# Patient Record
Sex: Female | Born: 2000 | Race: Black or African American | Hispanic: No | Marital: Married
Health system: Midwestern US, Community
[De-identification: ages and names within clinical notes are randomized; demographics above are authoritative.]

## PROBLEM LIST (undated history)

## (undated) DIAGNOSIS — O139 Gestational [pregnancy-induced] hypertension without significant proteinuria, unspecified trimester: Secondary | ICD-10-CM

## (undated) HISTORY — DX: Gestational (pregnancy-induced) hypertension without significant proteinuria, unspecified trimester: O13.9

## (undated) HISTORY — PX: WISDOM TOOTH EXTRACTION: SHX21

---
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
## 2018-10-02 ENCOUNTER — Telehealth: Payer: Self-pay | Admitting: Family Medicine

## 2018-10-02 NOTE — Telephone Encounter (Signed)
Attempted to call patient about her appointment on 8/19 @ 10:20. The home number was the patient's mother's number and mother gave me patient's number ( cell number). Cell number was called and there was no answer, voicemail left for patient to give the office a call back about her appointment.

## 2018-10-03 ENCOUNTER — Ambulatory Visit: Payer: Medicaid Other

## 2018-11-14 DIAGNOSIS — O26891 Other specified pregnancy related conditions, first trimester: Secondary | ICD-10-CM

## 2018-11-14 NOTE — ED Notes (Signed)
Patient complains of having a headache that started 3 days ago- patient also states that she has abdominal pain. Lower back pain . Leg pain- patient denies painful urination- states that she a a white discharge that PCP states is normal.

## 2018-11-14 NOTE — ED Triage Notes (Signed)
Patient complains of having a headache that started 3 days ago- patient also states that she has abdominal pain. Lower back pain . Leg pain- patient denies painful urination- states that she a a white discharge that PCP states is normal.

## 2018-11-15 ENCOUNTER — Inpatient Hospital Stay
Admit: 2018-11-15 | Discharge: 2018-11-15 | Disposition: A | Discharge status: Home / Self Care | Attending: Emergency Medicine

## 2018-11-15 LAB — CBC WITH AUTO DIFFERENTIAL
Basophils %: 0 % (ref 0–2)
Basophils Absolute: 0 10*3/uL (ref 0.0–0.1)
Eosinophils %: 1 % (ref 0–5)
Eosinophils Absolute: 0.1 10*3/uL (ref 0.0–0.4)
Hematocrit: 31.6 % — ABNORMAL LOW (ref 35.0–45.0)
Hemoglobin: 11.1 g/dL — ABNORMAL LOW (ref 12.0–16.0)
Lymphocytes %: 11 % — ABNORMAL LOW (ref 21–52)
Lymphocytes Absolute: 1 10*3/uL (ref 0.9–3.6)
MCH: 28.5 PG (ref 24.0–34.0)
MCHC: 35.1 g/dL (ref 31.0–37.0)
MCV: 81 FL (ref 74.0–97.0)
MPV: 8.6 FL — ABNORMAL LOW (ref 9.2–11.8)
Monocytes %: 7 % (ref 3–10)
Monocytes Absolute: 0.6 10*3/uL (ref 0.05–1.2)
Neutrophils %: 81 % — ABNORMAL HIGH (ref 40–73)
Neutrophils Absolute: 7.6 10*3/uL (ref 1.8–8.0)
Platelets: 355 10*3/uL (ref 135–420)
RBC: 3.9 M/uL — ABNORMAL LOW (ref 4.20–5.30)
RDW: 13.7 % (ref 11.6–14.5)
WBC: 9.2 10*3/uL (ref 4.6–13.2)

## 2018-11-15 LAB — COMPREHENSIVE METABOLIC PANEL
ALT: 20 U/L (ref 13–56)
AST: 14 U/L (ref 10–38)
Albumin/Globulin Ratio: 0.8 (ref 0.8–1.7)
Albumin: 3.1 g/dL — ABNORMAL LOW (ref 3.4–5.0)
Alkaline Phosphatase: 50 U/L (ref 45–117)
Anion Gap: 3 mmol/L (ref 3.0–18)
BUN: 10 MG/DL (ref 7.0–18)
Bun/Cre Ratio: 15 (ref 12–20)
CO2: 25 mmol/L (ref 21–32)
Calcium: 8.7 MG/DL (ref 8.5–10.1)
Chloride: 107 mmol/L (ref 100–111)
Creatinine: 0.65 MG/DL (ref 0.6–1.3)
EGFR IF NonAfrican American: >60 mL/min/{1.73_m2} (ref >60)
GFR African American: >60 mL/min/{1.73_m2} (ref >60)
Globulin: 4 g/dL (ref 2.0–4.0)
Glucose: 84 mg/dL (ref 74–99)
Potassium: 4 mmol/L (ref 3.5–5.5)
Sodium: 135 mmol/L — ABNORMAL LOW (ref 136–145)
Total Bilirubin: 0.3 MG/DL (ref 0.2–1.0)
Total Protein: 7.1 g/dL (ref 6.4–8.2)

## 2018-11-15 LAB — URINALYSIS W/ RFLX MICROSCOPIC
Bilirubin, Urine: NEGATIVE
Bilirubin: NEGATIVE
Blood, Urine: NEGATIVE
Blood: NEGATIVE
Glucose, Ur: NEGATIVE mg/dL
Glucose: NEGATIVE mg/dL
Ketone: NEGATIVE mg/dL
Ketones, Urine: NEGATIVE mg/dL
Leukocyte Esterase, Urine: NEGATIVE
Leukocyte Esterase: NEGATIVE
Nitrite, Urine: POSITIVE — AB
Nitrites: POSITIVE — AB
Protein, UA: NEGATIVE mg/dL
Protein: NEGATIVE mg/dL
Specific Gravity, UA: 1.016 (ref 1.005–1.030)
Specific gravity: 1.016 (ref 1.005–1.030)
Urobilinogen, UA, POCT: 0.2 EU/dL (ref 0.2–1.0)
Urobilinogen: 0.2 EU/dL (ref 0.2–1.0)
pH (UA): 6 (ref 5.0–8.0)
pH, UA: 6 (ref 5.0–8.0)

## 2018-11-15 LAB — URINE MICROSCOPIC ONLY
RBC, UA: 0 /hpf (ref 0–5)
RBC: 0 /hpf (ref 0–5)
WBC, UA: NEGATIVE /hpf (ref 0–4)
WBC: NEGATIVE /hpf (ref 0–4)

## 2018-11-15 LAB — HCG URINE, QL
HCG urine, QL: POSITIVE — AB
Pregnancy Test(Urn): POSITIVE — AB

## 2018-11-15 LAB — CBC WITH AUTOMATED DIFF
ABS. BASOPHILS: 0 10*3/uL (ref 0.0–0.1)
ABS. EOSINOPHILS: 0.1 10*3/uL (ref 0.0–0.4)
ABS. LYMPHOCYTES: 1 10*3/uL (ref 0.9–3.6)
ABS. MONOCYTES: 0.6 10*3/uL (ref 0.05–1.2)
ABS. NEUTROPHILS: 7.6 10*3/uL (ref 1.8–8.0)
BASOPHILS: 0 % (ref 0–2)
EOSINOPHILS: 1 % (ref 0–5)
HCT: 31.6 % — ABNORMAL LOW (ref 35.0–45.0)
HGB: 11.1 g/dL — ABNORMAL LOW (ref 12.0–16.0)
LYMPHOCYTES: 11 % — ABNORMAL LOW (ref 21–52)
MCH: 28.5 PG (ref 24.0–34.0)
MCHC: 35.1 g/dL (ref 31.0–37.0)
MCV: 81 FL (ref 74.0–97.0)
MONOCYTES: 7 % (ref 3–10)
MPV: 8.6 FL — ABNORMAL LOW (ref 9.2–11.8)
NEUTROPHILS: 81 % — ABNORMAL HIGH (ref 40–73)
PLATELET: 355 10*3/uL (ref 135–420)
RBC: 3.9 M/uL — ABNORMAL LOW (ref 4.20–5.30)
RDW: 13.7 % (ref 11.6–14.5)
WBC: 9.2 10*3/uL (ref 4.6–13.2)

## 2018-11-15 LAB — METABOLIC PANEL, COMPREHENSIVE
A-G Ratio: 0.8 (ref 0.8–1.7)
ALT (SGPT): 20 U/L (ref 13–56)
AST (SGOT): 14 U/L (ref 10–38)
Albumin: 3.1 g/dL — ABNORMAL LOW (ref 3.4–5.0)
Alk. phosphatase: 50 U/L (ref 45–117)
Anion gap: 3 mmol/L (ref 3.0–18)
BUN/Creatinine ratio: 15 (ref 12–20)
BUN: 10 MG/DL (ref 7.0–18)
Bilirubin, total: 0.3 MG/DL (ref 0.2–1.0)
CO2: 25 mmol/L (ref 21–32)
Calcium: 8.7 MG/DL (ref 8.5–10.1)
Chloride: 107 mmol/L (ref 100–111)
Creatinine: 0.65 MG/DL (ref 0.6–1.3)
GFR est AA: >60 mL/min/{1.73_m2} (ref >60)
GFR est non-AA: >60 mL/min/{1.73_m2} (ref >60)
Globulin: 4 g/dL (ref 2.0–4.0)
Glucose: 84 mg/dL (ref 74–99)
Potassium: 4 mmol/L (ref 3.5–5.5)
Protein, total: 7.1 g/dL (ref 6.4–8.2)
Sodium: 135 mmol/L — ABNORMAL LOW (ref 136–145)

## 2018-11-15 MED ORDER — METOCLOPRAMIDE 5 MG/ML IJ SOLN
5 mg/mL | INTRAMUSCULAR | Status: AC
Start: 2018-11-15 — End: 2018-11-15
  Administered 2018-11-15: 08:00:00 via INTRAVENOUS

## 2018-11-15 MED ORDER — SODIUM CHLORIDE 0.9% BOLUS IV
0.9 % | Freq: Once | INTRAVENOUS | Status: AC
Start: 2018-11-15 — End: 2018-11-15
  Administered 2018-11-15: 08:00:00 via INTRAVENOUS

## 2018-11-15 MED ORDER — CEPHALEXIN 500 MG CAP
500 mg | ORAL_CAPSULE | Freq: Two times a day (BID) | ORAL | 0 refills | Status: AC
Start: 2018-11-15 — End: 2018-11-18

## 2018-11-15 MED ORDER — DIPHENHYDRAMINE HCL 50 MG/ML IJ SOLN
50 mg/mL | INTRAMUSCULAR | Status: AC
Start: 2018-11-15 — End: 2018-11-15
  Administered 2018-11-15: 08:00:00 via INTRAVENOUS

## 2018-11-15 MED FILL — DIPHENHYDRAMINE HCL 50 MG/ML IJ SOLN: 50 mg/mL | INTRAMUSCULAR | Qty: 0.5

## 2018-11-15 MED FILL — DIPHENHYDRAMINE HCL 50 MG/ML IJ SOLN: 50 mg/mL | INTRAMUSCULAR | Qty: 1

## 2018-11-15 MED FILL — SODIUM CHLORIDE 0.9 % IV: INTRAVENOUS | Qty: 1000

## 2018-11-15 MED FILL — METOCLOPRAMIDE 5 MG/ML IJ SOLN: 5 mg/mL | INTRAMUSCULAR | Qty: 2

## 2018-11-15 NOTE — ED Provider Notes (Signed)
18 year old female at 37 weeks of pregnancy who presents to the emergency department today complaining of a week's timeframe of headache, low back pain, generalized abdominal discomfort.  Reports his symptoms are gradual and waxing and waning over time.  She is photophobic with some associated nausea but without any frank vomiting.  She denies any known exposure to COVID-19, does complain of some myalgias as well.  Denies any vaginal bleeding or discharge.  No diarrhea.  Reports that she previously did have an ultrasound that demonstrated an intrauterine pregnancy performed as an outpatient.  Pain in her abdomen she describes as located in the right flank region as well as the left upper quadrant.  She states the low back pain on the left side sometimes travels down her left leg.  No urinary incontinence or retention, no numbness, tingling or weakness in her extremities.           History reviewed. No pertinent past medical history.    History reviewed. No pertinent surgical history.      History reviewed. No pertinent family history.    Social History     Socioeconomic History   ??? Marital status: MARRIED     Spouse name: Not on file   ??? Number of children: Not on file   ??? Years of education: Not on file   ??? Highest education level: Not on file   Occupational History   ??? Not on file   Social Needs   ??? Financial resource strain: Not on file   ??? Food insecurity     Worry: Not on file     Inability: Not on file   ??? Transportation needs     Medical: Not on file     Non-medical: Not on file   Tobacco Use   ??? Smoking status: Never Smoker   ??? Smokeless tobacco: Never Used   Substance and Sexual Activity   ??? Alcohol use: Never     Frequency: Never   ??? Drug use: Never   ??? Sexual activity: Yes     Partners: Male   Lifestyle   ??? Physical activity     Days per week: Not on file     Minutes per session: Not on file   ??? Stress: Not on file   Relationships   ??? Social Product manager on phone: Not on file     Gets together:  Not on file     Attends religious service: Not on file     Active member of club or organization: Not on file     Attends meetings of clubs or organizations: Not on file     Relationship status: Not on file   ??? Intimate partner violence     Fear of current or ex partner: Not on file     Emotionally abused: Not on file     Physically abused: Not on file     Forced sexual activity: Not on file   Other Topics Concern   ??? Not on file   Social History Narrative   ??? Not on file         ALLERGIES: Patient has no known allergies.    Review of Systems   Constitutional: Negative for chills and fever.   HENT: Negative for congestion, rhinorrhea, sinus pressure and sneezing.    Eyes: Negative for pain and visual disturbance.   Respiratory: Negative for cough and shortness of breath.    Cardiovascular: Negative for chest pain.  Gastrointestinal: Positive for nausea and vomiting. Negative for abdominal pain and diarrhea.   Genitourinary: Negative for dysuria, frequency and urgency.   Musculoskeletal: Positive for back pain. Negative for neck pain.   Skin: Negative for rash.   Neurological: Positive for headaches. Negative for seizures, syncope, weakness and numbness.       Vitals:    11/14/18 2350 11/15/18 0340   BP: 131/81 (!) 117/51   Pulse: 83    Resp: 16    Temp: 99.7 ??F (37.6 ??C)    SpO2: 99% 100%   Weight: 86.2 kg (190 lb)    Height: '5\' 3"'  (1.6 m)             Physical Exam  Constitutional:       General: She is not in acute distress.     Appearance: Normal appearance. She is normal weight. She is not ill-appearing or toxic-appearing.   HENT:      Head: Normocephalic and atraumatic.      Right Ear: External ear normal.      Left Ear: External ear normal.      Nose: Nose normal. No congestion or rhinorrhea.      Mouth/Throat:      Mouth: Mucous membranes are moist.      Pharynx: Oropharynx is clear. No oropharyngeal exudate or posterior oropharyngeal erythema.   Eyes:      Extraocular Movements: Extraocular movements intact.       Pupils: Pupils are equal, round, and reactive to light.   Neck:      Musculoskeletal: Normal range of motion and neck supple. No muscular tenderness.   Cardiovascular:      Rate and Rhythm: Normal rate and regular rhythm.      Pulses: Normal pulses.      Heart sounds: Normal heart sounds. No murmur.   Pulmonary:      Effort: Pulmonary effort is normal.      Breath sounds: Normal breath sounds. No wheezing, rhonchi or rales.   Abdominal:      General: Abdomen is flat. Bowel sounds are normal.      Palpations: Abdomen is soft.      Tenderness: There is no abdominal tenderness. There is no guarding or rebound. Negative signs include Murphy's sign and McBurney's sign.   Musculoskeletal: Normal range of motion.         General: No swelling, tenderness or deformity.   Skin:     General: Skin is warm and dry.      Capillary Refill: Capillary refill takes less than 2 seconds.      Findings: No rash.   Neurological:      General: No focal deficit present.      Mental Status: She is alert and oriented to person, place, and time.      Motor: No weakness.      Comments: Patient is alert, oriented x4.  Patient responds appropriately to questioning.  Cranial nerves II through XII are grossly intact.  Equal strength in the upper and lower extremities bilaterally which is symmetric.  Finger-to-nose and heel-to-shin testing is normal.  Normal sensory exam.            Recent Results (from the past 24 hour(s))   URINALYSIS W/ RFLX MICROSCOPIC    Collection Time: 11/15/18 12:00 AM   Result Value Ref Range    Color YELLOW      Appearance CLEAR      Specific gravity 1.016 1.005 - 1.030  pH (UA) 6.0 5.0 - 8.0      Protein Negative NEG mg/dL    Glucose Negative NEG mg/dL    Ketone Negative NEG mg/dL    Bilirubin Negative NEG      Blood Negative NEG      Urobilinogen 0.2 0.2 - 1.0 EU/dL    Nitrites Positive (A) NEG      Leukocyte Esterase Negative NEG     HCG URINE, QL    Collection Time: 11/15/18 12:00 AM   Result Value Ref Range     HCG urine, QL Positive (A) NEG     URINE MICROSCOPIC ONLY    Collection Time: 11/15/18 12:00 AM   Result Value Ref Range    WBC Negative 0 - 4 /hpf    RBC 0 to 3 0 - 5 /hpf    Epithelial cells FEW 0 - 5 /lpf    Bacteria 1+ (A) NEG /hpf   CBC WITH AUTOMATED DIFF    Collection Time: 11/15/18  2:59 AM   Result Value Ref Range    WBC 9.2 4.6 - 13.2 K/uL    RBC 3.90 (L) 4.20 - 5.30 M/uL    HGB 11.1 (L) 12.0 - 16.0 g/dL    HCT 31.6 (L) 35.0 - 45.0 %    MCV 81.0 74.0 - 97.0 FL    MCH 28.5 24.0 - 34.0 PG    MCHC 35.1 31.0 - 37.0 g/dL    RDW 13.7 11.6 - 14.5 %    PLATELET 355 135 - 420 K/uL    MPV 8.6 (L) 9.2 - 11.8 FL    NEUTROPHILS 81 (H) 40 - 73 %    LYMPHOCYTES 11 (L) 21 - 52 %    MONOCYTES 7 3 - 10 %    EOSINOPHILS 1 0 - 5 %    BASOPHILS 0 0 - 2 %    ABS. NEUTROPHILS 7.6 1.8 - 8.0 K/UL    ABS. LYMPHOCYTES 1.0 0.9 - 3.6 K/UL    ABS. MONOCYTES 0.6 0.05 - 1.2 K/UL    ABS. EOSINOPHILS 0.1 0.0 - 0.4 K/UL    ABS. BASOPHILS 0.0 0.0 - 0.1 K/UL    DF AUTOMATED     METABOLIC PANEL, COMPREHENSIVE    Collection Time: 11/15/18  2:59 AM   Result Value Ref Range    Sodium 135 (L) 136 - 145 mmol/L    Potassium 4.0 3.5 - 5.5 mmol/L    Chloride 107 100 - 111 mmol/L    CO2 25 21 - 32 mmol/L    Anion gap 3 3.0 - 18 mmol/L    Glucose 84 74 - 99 mg/dL    BUN 10 7.0 - 18 MG/DL    Creatinine 0.65 0.6 - 1.3 MG/DL    BUN/Creatinine ratio 15 12 - 20      GFR est AA >60 >60 ml/min/1.55m    GFR est non-AA >60 >60 ml/min/1.78m   Calcium 8.7 8.5 - 10.1 MG/DL    Bilirubin, total 0.3 0.2 - 1.0 MG/DL    ALT (SGPT) 20 13 - 56 U/L    AST (SGOT) 14 10 - 38 U/L    Alk. phosphatase 50 45 - 117 U/L    Protein, total 7.1 6.4 - 8.2 g/dL    Albumin 3.1 (L) 3.4 - 5.0 g/dL    Globulin 4.0 2.0 - 4.0 g/dL    A-G Ratio 0.8 0.8 - 1.7           MDM  Number of Diagnoses or Management Options  Asymptomatic bacteriuria during pregnancy in first trimester:   Nonintractable episodic headache, unspecified headache type:   Viral syndrome:   Diagnosis management  comments: Patient presents at 12 weeks of pregnancy with complaint of headache, myalgias, left-sided low back pain, generalized abdominal discomfort and nausea.  Some of the changes she is experiencing are partly expected for this stage of pregnancy, however it appears she may have a viral illness as well.  Patient temperature is slightly elevated on presentation which also could go along with pregnancy however given her symptoms I suspect that this may be a viral illness.  I will swab her for COVID.  I will have the patient self isolate for several days.  Patient received IV Reglan, Benadryl and fluid bolus and on reassessment she is resting comfortably reporting her symptoms have improved.  I do not suspect acute intracranial process such as dural sinus thrombosis, cavernous sinus thrombosis, pseudotumor, subarachnoid hemorrhage??????given chronicity of symptoms intermittent for the past 1 week, space-occupying lesion or other acute process within the cerebrum.  I will discharge the patient home and I will give her primary care follow-up instructions as well as a work note to stay home from her job until she is received a call back for the testing.  Patient expressed understanding of plan for discharge, all questions were answered.  Patient stable for discharge home.    At this time, patient is stable and appropriate for discharge home. ??Patient demonstrates understanding of current diagnoses and is in agreement with the treatment plan. ??They are advised that while the likelihood of serious underlying condition is low at this point given the evaluation performed today, we cannot fully rule it out. ??They are advised to immediately return with any new symptoms or worsening of current condition. ??All questions have been answered. ??Patient is given educational material regarding their diagnoses, including danger symptoms and when to return to the ED.         ED Course as of Nov 14 1608   Thu Nov 15, 2018   0436 FHT: 160,  +Fetal movement on POC [place]    [JP]      ED Course User Index  [JP] Luetta Nutting, DO       Procedures      Diagnosis:   1. Nonintractable episodic headache, unspecified headache type    2. Viral syndrome    3. Asymptomatic bacteriuria during pregnancy in first trimester          Disposition: Discharge    Follow-up Information     Follow up With Specialties Details [place] ([place])  In 3 days  [address]  [place]  [phone]    [facility] EMERGENCY DEPT Emergency Medicine  As needed, If symptoms worsen [place]  [phone]          Discharge Medication List as of 11/15/2018  5:08 AM      START taking these medications    Details   cephALEXin (Keflex) 500 mg capsule Take 1 Cap by mouth two (2) times a day for 3 days., Print, Disp-6 Cap,R-0

## 2018-11-15 NOTE — ED Provider Notes (Signed)
18 year old female at 21 weeks of pregnancy who presents to the emergency department today complaining of a week's timeframe of headache, low back pain, generalized abdominal discomfort.  Reports his symptoms are gradual and waxing and waning over time.  She is photophobic with some associated nausea but without any frank vomiting.  She denies any known exposure to COVID-19, does complain of some myalgias as well.  Denies any vaginal bleeding or discharge.  No diarrhea.  Reports that she previously did have an ultrasound that demonstrated an intrauterine pregnancy performed as an outpatient.  Pain in her abdomen she describes as located in the right flank region as well as the left upper quadrant.  She states the low back pain on the left side sometimes travels down her left leg.  No urinary incontinence or retention, no numbness, tingling or weakness in her extremities.           History reviewed. No pertinent past medical history.    History reviewed. No pertinent surgical history.      History reviewed. No pertinent family history.    Social History     Socioeconomic History   ??? Marital status: MARRIED     Spouse name: Not on file   ??? Number of children: Not on file   ??? Years of education: Not on file   ??? Highest education level: Not on file   Occupational History   ??? Not on file   Social Needs   ??? Financial resource strain: Not on file   ??? Food insecurity     Worry: Not on file     Inability: Not on file   ??? Transportation needs     Medical: Not on file     Non-medical: Not on file   Tobacco Use   ??? Smoking status: Never Smoker   ??? Smokeless tobacco: Never Used   Substance and Sexual Activity   ??? Alcohol use: Never     Frequency: Never   ??? Drug use: Never   ??? Sexual activity: Yes     Partners: Male   Lifestyle   ??? Physical activity     Days per week: Not on file     Minutes per session: Not on file   ??? Stress: Not on file   Relationships   ??? Social Product manager on phone: Not on file      Gets together: Not on file     Attends religious service: Not on file     Active member of club or organization: Not on file     Attends meetings of clubs or organizations: Not on file     Relationship status: Not on file   ??? Intimate partner violence     Fear of current or ex partner: Not on file     Emotionally abused: Not on file     Physically abused: Not on file     Forced sexual activity: Not on file   Other Topics Concern   ??? Not on file   Social History Narrative   ??? Not on file         ALLERGIES: Patient has no known allergies.    Review of Systems   Constitutional: Negative for chills and fever.   HENT: Negative for congestion, rhinorrhea, sinus pressure and sneezing.    Eyes: Negative for pain and visual disturbance.   Respiratory: Negative for cough and shortness of breath.    Cardiovascular: Negative for chest pain.  Gastrointestinal: Positive for nausea and vomiting. Negative for abdominal pain and diarrhea.   Genitourinary: Negative for dysuria, frequency and urgency.   Musculoskeletal: Positive for back pain. Negative for neck pain.   Skin: Negative for rash.   Neurological: Positive for headaches. Negative for seizures, syncope, weakness and numbness.       Vitals:    11/14/18 2350 11/15/18 0340   BP: 131/81 (!) 117/51   Pulse: 83    Resp: 16    Temp: 99.7 ??F (37.6 ??C)    SpO2: 99% 100%   Weight: 86.2 kg (190 lb)    Height: '5\' 3"'$  (1.6 m)             Physical Exam  Constitutional:       General: She is not in acute distress.     Appearance: Normal appearance. She is normal weight. She is not ill-appearing or toxic-appearing.   HENT:      Head: Normocephalic and atraumatic.      Right Ear: External ear normal.      Left Ear: External ear normal.      Nose: Nose normal. No congestion or rhinorrhea.      Mouth/Throat:      Mouth: Mucous membranes are moist.      Pharynx: Oropharynx is clear. No oropharyngeal exudate or posterior oropharyngeal erythema.   Eyes:       Extraocular Movements: Extraocular movements intact.      Pupils: Pupils are equal, round, and reactive to light.   Neck:      Musculoskeletal: Normal range of motion and neck supple. No muscular tenderness.   Cardiovascular:      Rate and Rhythm: Normal rate and regular rhythm.      Pulses: Normal pulses.      Heart sounds: Normal heart sounds. No murmur.   Pulmonary:      Effort: Pulmonary effort is normal.      Breath sounds: Normal breath sounds. No wheezing, rhonchi or rales.   Abdominal:      General: Abdomen is flat. Bowel sounds are normal.      Palpations: Abdomen is soft.      Tenderness: There is no abdominal tenderness. There is no guarding or rebound. Negative signs include Murphy's sign and McBurney's sign.   Musculoskeletal: Normal range of motion.         General: No swelling, tenderness or deformity.   Skin:     General: Skin is warm and dry.      Capillary Refill: Capillary refill takes less than 2 seconds.      Findings: No rash.   Neurological:      General: No focal deficit present.      Mental Status: She is alert and oriented to person, place, and time.      Motor: No weakness.      Comments: Patient is alert, oriented x4.  Patient responds appropriately to questioning.  Cranial nerves II through XII are grossly intact.  Equal strength in the upper and lower extremities bilaterally which is symmetric.  Finger-to-nose and heel-to-shin testing is normal.  Normal sensory exam.            Recent Results (from the past 24 hour(s))   URINALYSIS W/ RFLX MICROSCOPIC    Collection Time: 11/15/18 12:00 AM   Result Value Ref Range    Color YELLOW      Appearance CLEAR      Specific gravity 1.016 1.005 - 1.030  pH (UA) 6.0 5.0 - 8.0      Protein Negative NEG mg/dL    Glucose Negative NEG mg/dL    Ketone Negative NEG mg/dL    Bilirubin Negative NEG      Blood Negative NEG      Urobilinogen 0.2 0.2 - 1.0 EU/dL    Nitrites Positive (A) NEG      Leukocyte Esterase Negative NEG     HCG URINE, QL     Collection Time: 11/15/18 12:00 AM   Result Value Ref Range    HCG urine, QL Positive (A) NEG     URINE MICROSCOPIC ONLY    Collection Time: 11/15/18 12:00 AM   Result Value Ref Range    WBC Negative 0 - 4 /hpf    RBC 0 to 3 0 - 5 /hpf    Epithelial cells FEW 0 - 5 /lpf    Bacteria 1+ (A) NEG /hpf   CBC WITH AUTOMATED DIFF    Collection Time: 11/15/18  2:59 AM   Result Value Ref Range    WBC 9.2 4.6 - 13.2 K/uL    RBC 3.90 (L) 4.20 - 5.30 M/uL    HGB 11.1 (L) 12.0 - 16.0 g/dL    HCT 31.6 (L) 35.0 - 45.0 %    MCV 81.0 74.0 - 97.0 FL    MCH 28.5 24.0 - 34.0 PG    MCHC 35.1 31.0 - 37.0 g/dL    RDW 13.7 11.6 - 14.5 %    PLATELET 355 135 - 420 K/uL    MPV 8.6 (L) 9.2 - 11.8 FL    NEUTROPHILS 81 (H) 40 - 73 %    LYMPHOCYTES 11 (L) 21 - 52 %    MONOCYTES 7 3 - 10 %    EOSINOPHILS 1 0 - 5 %    BASOPHILS 0 0 - 2 %    ABS. NEUTROPHILS 7.6 1.8 - 8.0 K/UL    ABS. LYMPHOCYTES 1.0 0.9 - 3.6 K/UL    ABS. MONOCYTES 0.6 0.05 - 1.2 K/UL    ABS. EOSINOPHILS 0.1 0.0 - 0.4 K/UL    ABS. BASOPHILS 0.0 0.0 - 0.1 K/UL    DF AUTOMATED     METABOLIC PANEL, COMPREHENSIVE    Collection Time: 11/15/18  2:59 AM   Result Value Ref Range    Sodium 135 (L) 136 - 145 mmol/L    Potassium 4.0 3.5 - 5.5 mmol/L    Chloride 107 100 - 111 mmol/L    CO2 25 21 - 32 mmol/L    Anion gap 3 3.0 - 18 mmol/L    Glucose 84 74 - 99 mg/dL    BUN 10 7.0 - 18 MG/DL    Creatinine 0.65 0.6 - 1.3 MG/DL    BUN/Creatinine ratio 15 12 - 20      GFR est AA >60 >60 ml/min/1.56m    GFR est non-AA >60 >60 ml/min/1.765m   Calcium 8.7 8.5 - 10.1 MG/DL    Bilirubin, total 0.3 0.2 - 1.0 MG/DL    ALT (SGPT) 20 13 - 56 U/L    AST (SGOT) 14 10 - 38 U/L    Alk. phosphatase 50 45 - 117 U/L    Protein, total 7.1 6.4 - 8.2 g/dL    Albumin 3.1 (L) 3.4 - 5.0 g/dL    Globulin 4.0 2.0 - 4.0 g/dL    A-G Ratio 0.8 0.8 - 1.7           MDM  Number of Diagnoses or Management Options  Asymptomatic bacteriuria during pregnancy in first trimester:    Nonintractable episodic headache, unspecified headache type:   Viral syndrome:   Diagnosis management comments: Patient presents at 12 weeks of pregnancy with complaint of headache, myalgias, left-sided low back pain, generalized abdominal discomfort and nausea.  Some of the changes she is experiencing are partly expected for this stage of pregnancy, however it appears she may have a viral illness as well.  Patient temperature is slightly elevated on presentation which also could go along with pregnancy however given her symptoms I suspect that this may be a viral illness.  I will swab her for COVID.  I will have the patient self isolate for several days.  Patient received IV Reglan, Benadryl and fluid bolus and on reassessment she is resting comfortably reporting her symptoms have improved.  I do not suspect acute intracranial process such as dural sinus thrombosis, cavernous sinus thrombosis, pseudotumor, subarachnoid hemorrhage??????given chronicity of symptoms intermittent for the past 1 week, space-occupying lesion or other acute process within the cerebrum.  I will discharge the patient home and I will give her primary care follow-up instructions as well as a work note to stay home from her job until she is received a call back for the testing.  Patient expressed understanding of plan for discharge, all questions were answered.  Patient stable for discharge home.    At this time, patient is stable and appropriate for discharge home. ??Patient demonstrates understanding of current diagnoses and is in agreement with the treatment plan. ??They are advised that while the likelihood of serious underlying condition is low at this point given the evaluation performed today, we cannot fully rule it out. ??They are advised to immediately return with any new symptoms or worsening of current condition. ??All questions have been answered. ??Patient is given educational material regarding their diagnoses, including danger symptoms  and when to return to the ED.         ED Course as of Nov 14 1608   Thu Nov 15, 2018   0436 FHT: 160, +Fetal movement on POC [place]    [JP]      ED Course User Index  [JP] Luetta Nutting, DO       Procedures      Diagnosis:   1. Nonintractable episodic headache, unspecified headache type    2. Viral syndrome    3. Asymptomatic bacteriuria during pregnancy in first trimester          Disposition: Discharge    Follow-up Information     Follow up With Specialties Details [facility] ([place])  In 3 days  [address]  [place]  [phone]    [facility] EMERGENCY DEPT Emergency Medicine  As needed, If symptoms worsen [place]  [phone]          Discharge Medication List as of 11/15/2018  5:08 AM      START taking these medications    Details   cephALEXin (Keflex) 500 mg capsule Take 1 Cap by mouth two (2) times a day for 3 days., Print, Disp-6 Cap,R-0

## 2018-11-16 NOTE — Telephone Encounter (Signed)
Patient contacted regarding recent discharge and COVID-19 risk   Care Coordinator contacted the patient by telephone to perform post discharge assessment. Verified name and DOB with patient as identifiers.     Patient has following risk factors of: no known risk factors. Care Coordinator reviewed discharge instructions, medical action plan and red flags related to discharge diagnosis. Reviewed and educated them on any new and changed medications related to discharge diagnosis.  Advised obtaining a 90-day supply of all daily and as-needed medications.     Education provided regarding infection prevention, and signs and symptoms of COVID-19 and when to seek medical attention with patient who verbalized understanding. Discussed exposure protocols and quarantine from CDC Guidelines ???Are you at higher risk for severe illness 2019??? and given an opportunity for questions and concerns. The patient agrees to contact the COVID-19 hotline [phone] or PCP office for questions related to their healthcare. Care Coordinator provided contact information for future reference.    From CDC: Are you at higher risk for severe illness?    ??? Wash your hands often.  ??? Avoid close contact (6 feet, which is about two arm lengths) with people who are sick.  ??? Put distance between yourself and other people if COVID-19 is spreading in your community.  ??? Clean and disinfect frequently touched surfaces.  ??? Avoid all cruise travel and non-essential air travel.  ??? Call your healthcare professional if you have concerns about COVID-19 and your underlying condition or if you are sick.    For more information on steps you can take to protect yourself, see CDC's How to Protect Yourself      Patient/family/caregiver given information for GetWell Loop and agrees to enroll no  Patient's preferred e-mail:    Patient's preferred phone number:   Based on Loop alert triggers, patient will be contacted by nurse care manager for worsening symptoms.     Plan for follow-up call in 7-14 days based on severity of symptoms and risk factors.

## 2018-11-17 LAB — COVID-19: SARS-CoV-2: NOT DETECTED

## 2018-11-17 LAB — NOVEL CORONAVIRUS (COVID-19): SARS-CoV-2: NOT DETECTED

## 2018-12-05 NOTE — Telephone Encounter (Signed)
Patient resolved from Transition of Care episode on 12/05/2018  Discussed COVID-19 related testing which was available at this time. Test results were negative. Patient informed of results, if available? Unable to reach patient for follow up episode closed

## 2019-02-12 ENCOUNTER — Encounter: Payer: Self-pay | Admitting: Family Medicine

## 2019-02-12 ENCOUNTER — Other Ambulatory Visit: Payer: Self-pay

## 2019-02-12 ENCOUNTER — Other Ambulatory Visit (HOSPITAL_COMMUNITY)
Admission: RE | Admit: 2019-02-12 | Discharge: 2019-02-12 | Disposition: A | Discharge status: Home / Self Care | Payer: BLUE CROSS/BLUE SHIELD | Source: Ambulatory Visit | Attending: Family Medicine | Admitting: Family Medicine

## 2019-02-12 ENCOUNTER — Ambulatory Visit (INDEPENDENT_AMBULATORY_CARE_PROVIDER_SITE_OTHER): Payer: Medicaid Other

## 2019-02-12 DIAGNOSIS — Z3492 Encounter for supervision of normal pregnancy, unspecified, second trimester: Secondary | ICD-10-CM

## 2019-02-12 DIAGNOSIS — Z3490 Encounter for supervision of normal pregnancy, unspecified, unspecified trimester: Secondary | ICD-10-CM | POA: Insufficient documentation

## 2019-02-12 DIAGNOSIS — Z3201 Encounter for pregnancy test, result positive: Secondary | ICD-10-CM

## 2019-02-12 LAB — POCT PREGNANCY, URINE: Preg Test, Ur: POSITIVE — AB

## 2019-02-12 NOTE — Progress Notes (Signed)
Chart reviewed - agree with RN documentation.   
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
## 2019-02-13 LAB — GC/CHLAMYDIA PROBE AMP (~~LOC~~) NOT AT ARMC
Chlamydia: NEGATIVE
Comment: NEGATIVE
Comment: NORMAL
Neisseria Gonorrhea: NEGATIVE

## 2019-02-13 LAB — OBSTETRIC PANEL, INCLUDING HIV
Antibody Screen: NEGATIVE
Basophils Absolute: 0.1 10*3/uL (ref 0.0–0.2)
Basos: 1 %
EOS (ABSOLUTE): 0.2 10*3/uL (ref 0.0–0.4)
Eos: 2 %
HIV Screen 4th Generation wRfx: NONREACTIVE
Hematocrit: 34.4 % (ref 34.0–46.6)
Hemoglobin: 11 g/dL — ABNORMAL LOW (ref 11.1–15.9)
Hepatitis B Surface Ag: NEGATIVE
Immature Grans (Abs): 0.2 10*3/uL — ABNORMAL HIGH (ref 0.0–0.1)
Immature Granulocytes: 1 %
Lymphocytes Absolute: 1.6 10*3/uL (ref 0.7–3.1)
Lymphs: 13 %
MCH: 27.8 pg (ref 26.6–33.0)
MCHC: 32 g/dL (ref 31.5–35.7)
MCV: 87 fL (ref 79–97)
Monocytes Absolute: 0.7 10*3/uL (ref 0.1–0.9)
Monocytes: 6 %
Neutrophils Absolute: 9 10*3/uL — ABNORMAL HIGH (ref 1.4–7.0)
Neutrophils: 77 %
Platelets: 365 10*3/uL (ref 150–450)
RBC: 3.95 x10E6/uL (ref 3.77–5.28)
RDW: 13.3 % (ref 11.7–15.4)
RPR Ser Ql: NONREACTIVE
Rh Factor: POSITIVE
Rubella Antibodies, IGG: 1.47 index (ref >0.99)
WBC: 11.8 10*3/uL — ABNORMAL HIGH (ref 3.4–10.8)

## 2019-02-13 LAB — HEMOGLOBIN A1C
Est. average glucose Bld gHb Est-mCnc: 91 mg/dL
Hgb A1c MFr Bld: 4.8 % (ref 4.8–5.6)

## 2019-02-15 LAB — CULTURE, OB URINE

## 2019-02-15 LAB — URINE CULTURE, OB REFLEX

## 2019-02-15 NOTE — L&D Delivery Note (Addendum)
OB/GYN Faculty Practice Delivery Note  Brianna Bender is a 19 y.o. G2P1011 s/p SVD at [redacted]w[redacted]d. She was admitted for IOL for gHTN.   AROM: 8h 38m with clear fluid GBS Status:  Negative/-- (03/18 1542) Maximum Maternal Temperature: 99.1 F  Labor Progress: . Patient presented to L&D for IOL for gHTN. Initial SVE: 1/50/ballotable. Labor course was uncomplicated. Induced with cytotec and foley bulb. Augmented with pitocin and AROM. She then progressed to complete.   Delivery Date/Time: 05/10/2019 @ 2357 Delivery: Called to room and patient was complete and pushing. Head position was ROA and delivered with ease over the perineum. No nuchal cord present. Shoulder and body delivered in usual fashion. Infant with spontaneous cry, placed on mother's abdomen, dried and stimulated. Cord clamped x 2 after 1-minute delay, and cut by FOB. Cord blood drawn. Placenta delivered spontaneously with gentle cord traction. Fundus firm with massage and pitocin started. Labia, perineum, vagina, and cervix inspected and significant for right labial laceration and 1st degree perineal laceration which were repaired with 3-0 Vicryl in a standard fashion.  Baby Weight: 2960g  Cord: central insertion, 3 vessel Placenta: Sent to L&D Complications: None Lacerations: Right labial and 1st degree perineal lacerations EBL: 446cc Analgesia: Epidural   Infant:  APGAR (1 MIN): 9   APGAR (5 MINS): 9    Brianna Jewett MD, PGY-1 OBGYN Faculty Teaching Service  05/11/2019, 1:43 AM   OB FELLOW DELIVERY ATTESTATION  I was gloved and present for the delivery in its entirety, and I agree with the above resident's note.    Jerilynn Birkenhead, MD [facility] Family Medicine Fellow, [facility] for [employer], [facility] - [facility]

## 2019-02-24 ENCOUNTER — Encounter: Payer: Self-pay | Admitting: Student

## 2019-02-24 DIAGNOSIS — O0932 Supervision of pregnancy with insufficient antenatal care, second trimester: Secondary | ICD-10-CM | POA: Insufficient documentation

## 2019-02-25 ENCOUNTER — Encounter: Payer: Self-pay | Admitting: Student

## 2019-02-25 ENCOUNTER — Ambulatory Visit (INDEPENDENT_AMBULATORY_CARE_PROVIDER_SITE_OTHER): Payer: BLUE CROSS/BLUE SHIELD | Admitting: Student

## 2019-02-25 ENCOUNTER — Other Ambulatory Visit: Payer: Self-pay

## 2019-02-25 VITALS — BP 126/84 | HR 81 | Wt 210.0 lb

## 2019-02-25 DIAGNOSIS — Z23 Encounter for immunization: Secondary | ICD-10-CM

## 2019-02-25 DIAGNOSIS — O0932 Supervision of pregnancy with insufficient antenatal care, second trimester: Secondary | ICD-10-CM

## 2019-02-25 DIAGNOSIS — Z3492 Encounter for supervision of normal pregnancy, unspecified, second trimester: Secondary | ICD-10-CM

## 2019-02-25 DIAGNOSIS — D573 Sickle-cell trait: Secondary | ICD-10-CM

## 2019-02-25 DIAGNOSIS — Z3A26 26 weeks gestation of pregnancy: Secondary | ICD-10-CM

## 2019-02-25 HISTORY — DX: Sickle-cell trait: D57.3

## 2019-02-25 NOTE — Patient Instructions (Signed)
The Maternity Assessment Unit (MAU) is located at the [facility] and [place] at [facility]. The address is: [address], [place], [place], [place] [address]. Please see map below for additional directions.    The Maternity Assessment Unit is designed to help you during your pregnancy, and for up to 6 weeks after delivery, with any pregnancy- or postpartum-related emergencies, if you think you are in labor, or if your water has broken. For example, if you experience nausea and vomiting, vaginal bleeding, severe abdominal or pelvic pain, elevated blood pressure or other problems related to your pregnancy or postpartum time, please come to the Maternity Assessment Unit for assistance.    AREA PEDIATRIC/FAMILY PRACTICE PHYSICIANS  Central/Southeast [place] [phone]) . [facility] Davy Pique, MD; Gwendlyn Deutscher, MD; Walker Kehr, MD; Andria Frames, MD; McDiarmid, MD; Dutch Quint, MD; Nori Riis, MD; Mingo Amber, [address] [place], [place] [address] o [phone] o Mon-Fri 8:30-12:30, 1:30-5:00 o Providers come to see babies at [facility] o Accepting Medicaid . [facility] [place] providers who accept newborns: Dorthy Cooler, MD; Orland Mustard, MD; Stephanie Acre, MD o [place], [place], [place] [address] o [phone] o Mon-Fri 8:00-5:30 o Babies seen by providers at [facility] o Does NOT accept Medicaid o Please call early in hospitalization for appointment (limited availability)  . Mustard [place], MD o [address], [place], [place] [address] o [phone] o Mon, Tue, Thur, Fri 8:30-5:00, Wed 10:00-7:00 (closed 1-2pm) o Babies seen by [facility] providers o Accepting Medicaid . [place], MD o [place], [place], [place] [address] o [phone] o Mon-Fri 8:30-5:00, Sat 8:30-12:00 o Provider comes to see babies at [place]  Medicaid o Must have been referred from current patients or contacted office prior to delivery . [place] [facility] ([place] for [place]) Franne Forts, MD; Tamera Punt, MD; Doneen Poisson, MD; Fatima Sanger, MD; Wynetta Emery, MD; Jess Barters, MD; Tami Ribas, MD; Herbert Moors, MD; Derrell Lolling, MD; Dorothyann Peng, MD; Lucious Groves, NP; Baldo Ash, NP o [address], [place], [place] [address] o [phone] o Mon, Tue, Thur, Fri 8:30-5:30, Wed 9:30-5:30, Sat 8:30-12:30 o Babies seen by [facility] providers o Accepting Medicaid o Only accepting infants of first-time parents or siblings of current patients [facility] discharge coordinator will make follow-up appointment . Baltazar Najjar o [place] [address], [place], [place]  [address] o [phone]   Fax - [phone] . [facility] o [address], [place], [place]  [address] o Phone - [phone]   Fax [phone] . [place], [place], [place], [place]  [address] o [phone]  East/[place]ortheast [place] [phone]) . [facility] Reginal Lutes, MD; Jacklynn Ganong, MD; Torrie Mayers, MD; MD; Rosana Hoes, MD; Servando Salina, MD; Rose Fillers, MD; Rex Kras, MD; Corinna Capra, MD; Volney [place], MD; Trilby Drummer, MD; Janann Colonel, MD; Jimmye Norman, [place], [place], [place] [address] o [phone] o Mon-Fri 8:30-5:00 (extended evenings Mon-Thur as needed), Sat-Sun 10:00-1:00 o Providers come to see babies at [place] Medicaid for families of first-time babies and families with all children in the household age 21 and under. Must register with office prior to making appointment (M-F only). . [place], NP; Tomi Bamberger, MD; Redmond School, MD; [place], [place] [address] [place],  [address] o [phone] o Mon-Fri 8:00-5:00 o Babies seen by providers at [facility] o Does  NOT accept Medicaid/Commercial Insurance Only . [facility] at [place] ([facility])  Suzette Battiesto Artis, MD; Zachery DauerBarnes, MD; Stefan ChurchBratton, MD; Sabino Dickoccaro, MD; Quitman LivingsLockett Gardner, MD; Farris HasKramer, MD; Gaynell FaceMarshall, MD; Betha LoaNetherton, MD; Colon FlatteryPoleto, MD; Clifton JamesSkinner, MD o [address] [place], [place] [address] o [phone] o Mon-Fri 8:30-5:30, Sat (Oct.-Mar.) 9:00-1:00 o Babies seen by providers at [facility] o Accepting [facility] [employer]  [place] [phone]) . [facility] [place] o Reid, MD; Sheliah HatchWarner, MD o [address], [place], [place] [address] o [phone] o Mon-Fri 8:30-5:00, Sat 8:30-12:00 o Providers come to see babies at [facility] o Does NOT accept Medicaid . [facility] Cindy Hazyo Becker, [place]; ColmaHagler, MD; DaleScifres, [place]; Wynelle LinkSun, MD; Azucena CecilSwayne, MD o [address], [place], [place] [address] o [phone] o Mon-Fri 8:00-5:00 o Babies seen by providers at [facility] o Does NOT accept Medicaid o Only accepting babies of parents who are patients o Please call early in hospitalization for appointment (limited availability) . [facility] Lamar Beneso Clark, MD; Abran CantorFrye, MD; Early OsmondKelleher, MD; Cherre HugerMack, NP; Hyacinth MeekerMiller, MD; Dwan Bolt'Keller, MD; Jarold MottoPatterson, NP; Dario GuardianPudlo, MD; Talmage NapPuzio, MD; Maisie Fushomas, MD; Pricilla Holmucker, MD; Tama Highwiselton, MD o [address], [place], [place] [address] o [phone] o Mon-Fri 8:00-5:00, Sat 9:00-12:00 o Providers come to see babies at [facility] o Does NOT accept [facility]  [place] [phone]) . [facility] o Limited providers accepting new patients: Drema PryBrake, NP; [place]arton, PA o [address], [place], [place] [address] o [phone] o Mon-Fri 8:00-5:00 o Babies seen by providers at [facility] o Does NOT accept Medicaid o Only accepting babies of parents who are patients o Please call early in hospitalization for appointment (limited availability) . [facility] Luan Pullingo Gay, MD; Nash DimmerQuinlan, MD o [address] [place], [place] [address] o [phone] (press 1 to schedule  appointment) o Mon-Fri 8:00-5:00 o Providers come to see babies at [facility] o Does NOT accept Medicaid . [facility] Cristino Marteso Mazer, MD o [address] [place], [place] [address] o [phone] o Mon-Fri 8:30-5:00 (lunch 12:30-1:00), extended hours by appointment only Wed 5:00-6:30 o Babies seen by [facility] providers o Accepting Medicaid .  [facility] o Banks, MD; SwazilandJordan, MD; Hassan RowanKoberlein, MD o [address], [place], [place] [address] o [phone] o Mon-Fri 8:00-5:00 o Babies seen by [facility] providers o Does NOT accept Medicaid . [facility] [place] [address] Sohoo Parker, MD; Durene CalHunter, MD; [place]ace, DO o [address] [place], [place] [address] o [phone] o Mon-Fri 8:00-5:00 o Babies seen by [facility] providers o Does NOT accept Medicaid . [facility] o [place], [place]; [place]cken, [place]; Oaklandhristy, NP; Avis Epleyees, MD; Vonna KotykeClaire, MD; Clance BolleWeese, MD; Stevphen RochesterHansen, NP; Arvilla MarketMills, NP; Ann MakiParrish, NP; Otis DialsSmoot, NP; Vaughan BastaSummer, MD; Salmon BrookVapne, MD o [address] [place], [place] [address] o [phone] o Mon-Fri 8:30-5:00, Sat 10:00-1:00 o Providers come to see babies at [facility] o Does NOT accept Medicaid o Free prenatal information session Tuesdays at 4:45pm . [facility] Luna Kitchenso Bouska, MD; JAARSGordon, [place]PA; [place], [place]; Weber, [place] o [address] [place] [address] o [phone] o Mon-Fri 7:30-5:30 o Babies seen by [facility] providers . [facility] o [address], [place], [place]  [address] o [phone]   Fax - [phone]  [place] [phone]) . [facility] Alphonsa Overallo Reese, MD o [address] [place], [place] [address] o [phone]  o Mon-Thur 8:00-6:00 o Providers come to see babies at [facility] o Accepting Medicaid . [facility] Zenon Mayo, NP; Cyndia Bent, MD; [place], [place]; [place], [place] o [address] [place], [place] [address] o [phone] o Mon-Thur 7:30-7:30, Fri 7:30-4:30 o Babies seen by [facility] providers o Accepting Medicaid . [facility] Cheryle Horsfall, MD; Janene Harvey, NP; Vonita Moss, MD o [address], [place], [place] [address] o [phone] o Mon-Fri 8:30-5:00, Sat 8:30-12:00 o Providers come to see babies at [facility] o Accepting Medicaid o Must have "Meet & Greet" appointment at office prior to delivery . [facility] [place] ([facility]) Llana Aliment, MD; Earlene Plater, MD; Lucretia Roers, MD o [address], [place], [place] [address] o [phone] o Mon-Wed 8:00-6:00, Thur-Fri 8:00-5:00, Sat 9:00-12:00 o Providers come to see babies at [facility] o Does NOT accept Medicaid o Only accepting siblings of current patients . [facility]  o [address], [place], [place]  [address] o [phone]   Fax - [phone] . [facility] at [facility] o [id number] [place], [place]  [address] o [phone]   Fax - [phone]  [place] [phone] & [phone]) . Nature conservation officer at [facility] o [place], DO; [employer]stone Arsenal, DO o [address] [place], [place] [address] o [phone] o Mon-Fri 7:00-5:00 o Babies seen by [facility] providers o Does NOT accept Medicaid . [facility] Ellis Savage, MD; [place], [place]; [place], [place] o [address], [place], [place] [address] o [phone] o Mon-Fri 8:00-5:00 o Babies seen by [facility] providers o Accepting Medicaid . [facility] - [address] Franne Forts, MD; [place], [place]; [place], NP; [place], [place] o [address] [place], [place], [place] [address] o [phone] o Mon-Fri 8:00-5:00 o Babies seen by providers at [facility] o Accepting [facility] [place] [phone]) . [facility] [place], [place] o [address] [place], [place] [address] o [phone] o Mon-Fri 8:00-5:00 o Babies seen by [facility] providers o Does NOT accept Medicaid o Limited availability, please call early in hospitalization to schedule follow-up . [facility] Jolee Ewing, PA; Eddie Candle, MD; [place], MD; [place], [place]; Constance Goltz, MD; [place], [place] o [address] [facility] [address], [place], [place] [address] o [phone] o Mon-Fri 8:30-5:00, Sat 9:00-12:00 o Babies seen by providers at [place] o Accepting Medicaid o Please register online then schedule online or call office o [email or web address] . [facility]) Samuella Bruin, NP; Lucianne Muss, MD; Lanier Clam, PA o [address], [place], [place] [address] o [phone] o Mon-Fri 8:00-5:00 o Babies seen by providers at [facility] o Accepting Medicaid . [facility] [place] ([facility]) Sharin Mons, MD; Reed Breech, NP; Shelva Majestic, MD o [address], [place], [place] [address] o [phone] o Mon-Fri 8:00-5:30, Sat&Sun by appointment (phones open at 8:30) o Babies seen by [facility] providers o Accepting Medicaid o Must be a first-time baby or sibling of current patient . [facility] [address], [place], [place]  [address] o [phone]   Fax - [phone]  [place] [phone] & [phone]) . [facility] o [place], [place]; [place], [place]; Dimple Casey, MD; [place], [place]; Carolyne Fiscal, MD o  [address], [place], [place] [address] o [phone] o Mon-Thur 8:00-7:00, Fri 8:00-5:00, Sat 8:00-12:00, Sun 9:00-12:00 o Babies seen by [facility] providers o Accepting Medicaid . [facility] at [facility], MD; Gaynell Face, MD; [facility], MD o [address], [place], [place]  [address] o [phone] o Mon-Thur 8:00-5:00 o Babies seen by providers at [facility] o Accepting Medicaid . [facility] at [place] Gwenlyn Saran, MD; Coe-Goins, MD; Madilyn Fireman, MD; Melvyn Neth, MD; List, MD; Lazarus Salines, MD; Gaynell Face, MD; Berneda Rose, MD; Flora Lipps, MD; Beryl Meager, MD; Luther Redo, MD; Lavonia Drafts, MD; Kellie Simmering, MD o [address] [place] [place], [place] [address] o [phone] o Mon-Fri 8:00-5:30, Sat (Oct.-Mar.) 9:00-1:00 o Babies seen by providers at [facility] o Accepting Medicaid o Must fill out new patient packet, available online at [email or web address] . [facility] at [facility]) Simone Curia, NP; Tiburcio Pea, NP; Tresa Endo, NP; [place] Post, MD; [place], [place]; Hennie Duos, MD; Wynne Dust, MD; Kavin Leech, NP o [address], [place], [place] [address] o [phone] o Mon-Thur 8:00-5:30, Fri 8:00-5:00 o Babies seen by providers at [facility] o Accepting [facility] [phone]) . [facility] o [place], [place]; Waggoner, MD; Tanya Nones, MD; [place], [place] o [address] [place], [place] [address] o [phone] o Mon-Fri 8:00-5:00 o Babies seen by providers at [facility] o Accepting [facility] [phone]) . [facility] at [facility] o [place], DO; Lenise Arena, MD; [place], [place] o [address], [place], [place] [address] o [phone] o Mon-Fri 8:00-5:00 o Babies seen by providers at [facility] o Does NOT accept Medicaid o Limited appointment availability, please call early in hospitalization  . Nature conservation officer at [facility] o [place], DO; Congress, MD o [address], [place], [place] [address] o [phone] o Mon-Fri 8:00-5:00 o Babies seen by [facility] providers o Does NOT accept Medicaid . [facility] Lorrine Kin, MD; Ninetta Lights, MD; [place], [place]; [place], MD o [address], [place], [place]  [address] o [phone] o Mon-Fri 8:00-5:00 o After hours clinic [facility] [place], [place] [address]) [phone] Mon-Fri 5:00-8:00, Sat 12:00-6:00, Sun 10:00-4:00 o Babies seen by [facility] providers o Accepting Medicaid . [facility] at [facility] o [address], [place], [place]  [address] o [phone]   Fax - [phone]  [place] [phone]) . Nature conservation officer at [facility], MD o [address] [place], [place], [place] [address] o [phone] o Mon-Fri 8:00-5:00 o Babies seen by [facility] providers o Does NOT accept Medicaid . [facility] - [place] [facility] [place]) Tomi Likens, MD o [address] [place] [address] [place], [place] [address] o [phone] o Mon-Thur 8:00-7:00, Fri 8:00-5:00, Sat 8:00-12:00 o Babies seen by providers at [facility] o Accepting Medicaid - but does not have vaccinations in office (must be received elsewhere) o Limited availability, please call early in hospitalization  [place] ([phone]) . [facility]  o Wyvonne Lenz, MD o [address], [place] [address] o [phone]  Fax [phone]      Third Trimester of Pregnancy  The third trimester is from week 28 through week 40 (months 7 through 9). This trimester is when your unborn baby (fetus) is growing very fast. At the end of the ninth month, the unborn  baby is about 20 inches in length. It weighs about 6-10 pounds. Follow these instructions at home: Medicines Take over-the-counter and prescription medicines only as told by your doctor. Some medicines are safe and some medicines are not safe during pregnancy. Take a prenatal vitamin that contains at least 600 micrograms (mcg) of folic acid. If you have trouble pooping (constipation), take medicine that will make your stool soft (stool softener) if your doctor approves. Eating and drinking  Eat regular, healthy meals. Avoid  raw meat and uncooked cheese. If you get low calcium from the food you eat, talk to your doctor about taking a daily calcium supplement. Eat four or five small meals rather than three large meals a day. Avoid foods that are high in fat and sugars, such as fried and sweet foods. To prevent constipation: Eat foods that are high in fiber, like fresh fruits and vegetables, whole grains, and beans. Drink enough fluids to keep your pee (urine) clear or pale yellow. Activity Exercise only as told by your doctor. Stop exercising if you start to have cramps. Avoid heavy lifting, wear low heels, and sit up straight. Do not exercise if it is too hot, too humid, or if you are in a place of great height (high altitude). You may continue to have sex unless your doctor tells you not to. Relieving pain and discomfort Wear a good support bra if your breasts are tender. Take frequent breaks and rest with your legs raised if you have leg cramps or low back pain. Take warm water baths (sitz baths) to soothe pain or discomfort caused by hemorrhoids. Use hemorrhoid cream if your doctor approves. If you develop puffy, bulging veins (varicose veins) in your legs: Wear support hose or compression stockings as told by your doctor. Raise (elevate) your feet for 15 minutes, 3-4 times a day. Limit salt in your food. Safety Wear your seat belt when driving. Make a list of emergency phone numbers, including numbers for family, friends, the hospital, and police and fire departments. Preparing for your baby's arrival To prepare for the arrival of your baby: Take prenatal classes. Practice driving to the hospital. Visit the hospital and tour the maternity area. Talk to your work about taking leave once the baby comes. Pack your hospital bag. Prepare the baby's room. Go to your doctor visits. Buy a rear-facing car seat. Learn how to install it in your car. General instructions Do not use hot tubs, steam rooms, or  saunas. Do not use any products that contain nicotine or tobacco, such as cigarettes and e-cigarettes. If you need help quitting, ask your doctor. Do not drink alcohol. Do not douche or use tampons or scented sanitary pads. Do not cross your legs for long periods of time. Do not travel for long distances unless you must. Only do so if your doctor says it is okay. Visit your dentist if you have not gone during your pregnancy. Use a soft toothbrush to brush your teeth. Be gentle when you floss. Avoid cat litter boxes and soil used by cats. These carry germs that can cause birth defects in the baby and can cause a loss of your baby (miscarriage) or stillbirth. Keep all your prenatal visits as told by your doctor. This is important. Contact a doctor if: You are not sure if you are in labor or if your water has broken. You are dizzy. You have mild cramps or pressure in your lower belly. You have a nagging pain in your belly area.  You continue to feel sick to your stomach, you throw up, or you have watery poop. You have bad smelling fluid coming from your vagina. You have pain when you pee. Get help right away if: You have a fever. You are leaking fluid from your vagina. You are spotting or bleeding from your vagina. You have severe belly cramps or pain. You lose or gain weight quickly. You have trouble catching your breath and have chest pain. You notice sudden or extreme puffiness (swelling) of your face, hands, ankles, feet, or legs. You have not felt the baby move in over an hour. You have severe headaches that do not go away with medicine. You have trouble seeing. You are leaking, or you are having a gush of fluid, from your vagina before you are 37 weeks. You have regular belly spasms (contractions) before you are 37 weeks. Summary The third trimester is from week 28 through week 40 (months 7 through 9). This time is when your unborn baby is growing very fast. Follow your doctor's  advice about medicine, food, and activity. Get ready for the arrival of your baby by taking prenatal classes, getting all the baby items ready, preparing the baby's room, and visiting your doctor to be checked. Get help right away if you are bleeding from your vagina, or you have chest pain and trouble catching your breath, or if you have not felt your baby move in over an hour. This information is not intended to replace advice given to you by your health care provider. Make sure you discuss any questions you have with your health care provider. Document Revised: 05/24/2018 Document Reviewed: 03/08/2016 Elsevier Patient Education  2020 [employer].

## 2019-02-25 NOTE — Progress Notes (Signed)
Subjective:   Brianna Bender is a 19 y.o. G2P0010 at [redacted]w[redacted]d by LMP being seen today for her first obstetrical visit.  Her obstetrical history is significant for obesity, late to care, and sickle cell carrier. Patient does intend to breast feed. Pregnancy history fully reviewed. Has support from her husband, her mother, and her mother in law. Recently moved to [place] from [place], [place]. Has not had prenatal care so far but has had 2 ultrasounds at outside locations to look at the baby.   Patient reports no complaints.  HISTORY: OB History  Gravida Para Term Preterm AB Living  2 0 0 0 1 0  SAB TAB Ectopic Multiple Live Births  0 1 0 0 0    # Outcome Date GA Lbr Len/2nd Weight Sex Delivery Anes PTL Lv  2 Current           1 TAB 04/2018           Past Medical History:  Diagnosis Date  . Asthma   . Sickle cell trait (HCC) 02/25/2019   QTrimester urine culture - 2nd trimester negative.    [ ]  3rd tri Given genetic counselor info to discuss FOB testing   Past Surgical History:  Procedure Laterality Date  . WISDOM TOOTH EXTRACTION     No family history on file. Social History   Tobacco Use  . Smoking status: Never Smoker  . Smokeless tobacco: Never Used  Substance Use Topics  . Alcohol use: Not Currently  . Drug use: Not Currently    Types: Marijuana   No Known Allergies Current Outpatient Medications on File Prior to Visit  Medication Sig Dispense Refill  . Prenatal MV & Min w/FA-DHA (PRENATAL ADULT GUMMY/DHA/FA PO) Take by mouth.     No current facility-administered medications on file prior to visit.    Exam   Vitals:   02/25/19 0910  BP: 126/84  Pulse: 81  Weight: 210 lb (95.3 kg)   Fetal Heart Rate (bpm): 151  Uterus:  Fundal Height: 27 cm  System: General: well-developed, well-nourished female in no acute distress   Breast:  normal appearance, no masses or tenderness   Skin: normal coloration and turgor, no rashes   Neurologic: oriented, normal,  negative, normal mood   Extremities: normal strength, tone, and muscle mass, ROM of all joints is normal   HEENT PERRLA, extraocular movement intact and sclera clear, anicteric   Cardiovascular: regular rate and rhythm   Respiratory:  no respiratory distress, normal breath sounds   Abdomen: soft, non-tender; bowel sounds normal; no masses,  no organomegaly     Assessment:   Pregnancy: G2P0010 Patient Active Problem List   Diagnosis Date Noted  . Sickle cell trait (HCC) 02/25/2019  . Late prenatal care affecting pregnancy in second trimester 02/24/2019  . Supervision of low-risk pregnancy 02/12/2019  . 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 Suicidal ideation 11/18/2016  . Asthma exacerbation 05/06/2013     Plan:  1. Encounter for supervision of low-risk pregnancy in second trimester -anatomy ultrasound scheduled - Tdap vaccine greater than or equal to 7yo IM - Glucose Tolerance, 2 Hours w/1 Hour  2. Sickle cell trait (HCC) -given contact info for genetic counselor and will discuss FOB testing with them  3. Late prenatal care affecting pregnancy in second trimester    Initial labs reviewed & 28 wk labs drawn today. Started on ASA - n/a, pt late to care Flu vax today-  pt declined Continue prenatal vitamins. Genetic Screening discussed, NIPS: results reviewed. Ultrasound discussed; fetal anatomic survey: ordered. Problem list reviewed and updated. The nature of [facility] with multiple MDs and other Advanced Practice Providers was explained to patient; also emphasized that residents, students are part of our team. Routine obstetric precautions reviewed. Return in about 3 weeks (around 03/18/2019) for Routine OB virtual.   Jorje Guild 12:50 PM 02/25/19

## 2019-02-26 LAB — GLUCOSE TOLERANCE, 2 HOURS W/ 1HR
Glucose, 1 hour: 100 mg/dL (ref 65–179)
Glucose, 2 hour: 89 mg/dL (ref 65–152)
Glucose, Fasting: 70 mg/dL (ref 65–91)

## 2019-03-04 ENCOUNTER — Other Ambulatory Visit: Payer: Self-pay | Admitting: Family Medicine

## 2019-03-04 ENCOUNTER — Other Ambulatory Visit: Payer: Self-pay

## 2019-03-04 ENCOUNTER — Ambulatory Visit (HOSPITAL_COMMUNITY)
Admission: RE | Admit: 2019-03-04 | Discharge: 2019-03-04 | Disposition: A | Discharge status: Home / Self Care | Payer: BLUE CROSS/BLUE SHIELD | Source: Ambulatory Visit | Attending: Family Medicine | Admitting: Family Medicine

## 2019-03-04 ENCOUNTER — Other Ambulatory Visit (HOSPITAL_COMMUNITY): Payer: Self-pay | Admitting: *Deleted

## 2019-03-04 DIAGNOSIS — Z3A27 27 weeks gestation of pregnancy: Secondary | ICD-10-CM

## 2019-03-04 DIAGNOSIS — Z3492 Encounter for supervision of normal pregnancy, unspecified, second trimester: Secondary | ICD-10-CM | POA: Insufficient documentation

## 2019-03-04 DIAGNOSIS — O99212 Obesity complicating pregnancy, second trimester: Secondary | ICD-10-CM | POA: Diagnosis not present

## 2019-03-04 DIAGNOSIS — O99891 Other specified diseases and conditions complicating pregnancy: Secondary | ICD-10-CM

## 2019-03-04 DIAGNOSIS — J45909 Unspecified asthma, uncomplicated: Secondary | ICD-10-CM | POA: Diagnosis not present

## 2019-03-04 DIAGNOSIS — Z362 Encounter for other antenatal screening follow-up: Secondary | ICD-10-CM

## 2019-03-06 ENCOUNTER — Encounter: Payer: Self-pay | Admitting: *Deleted

## 2019-03-18 ENCOUNTER — Telehealth (INDEPENDENT_AMBULATORY_CARE_PROVIDER_SITE_OTHER): Payer: BLUE CROSS/BLUE SHIELD | Admitting: Nurse Practitioner

## 2019-03-18 ENCOUNTER — Encounter: Payer: Self-pay | Admitting: Nurse Practitioner

## 2019-03-18 DIAGNOSIS — O0932 Supervision of pregnancy with insufficient antenatal care, second trimester: Secondary | ICD-10-CM

## 2019-03-18 DIAGNOSIS — Z3492 Encounter for supervision of normal pregnancy, unspecified, second trimester: Secondary | ICD-10-CM

## 2019-03-18 DIAGNOSIS — F332 Major depressive disorder, recurrent severe without psychotic features: Secondary | ICD-10-CM

## 2019-03-18 DIAGNOSIS — Z3A29 29 weeks gestation of pregnancy: Secondary | ICD-10-CM

## 2019-03-18 DIAGNOSIS — D573 Sickle-cell trait: Secondary | ICD-10-CM

## 2019-03-18 NOTE — Progress Notes (Signed)
Pt did not have BP Cuff with her, asks to take later & notate in BRx or My Chart, pt verbalized understanding.

## 2019-03-18 NOTE — Progress Notes (Signed)
@  1115am no answer LVM for appointment / will call back in 5 mins     11:28a- I connected with  Brianna Bender on 03/18/19 at 11:15 AM EST by telephone and verified that I am speaking with the correct person using two identifiers.   I discussed the limitations, risks, security and privacy concerns of performing an evaluation and management service by telephone and the availability of in person appointments. I also discussed with the patient that there may be a patient responsible charge related to this service. The patient expressed understanding and agreed to proceed.  Henrietta Dine, CMA 03/18/2019  11:28 AM

## 2019-03-18 NOTE — Progress Notes (Signed)
I connected with@ on 03/18/19 at 11:15 AM EST by: MyChart and verified that I am speaking with the correct person using two identifiers.  Patient is located at home and provider is located at [place] office.     The purpose of this virtual visit is to provide medical care while limiting exposure to the novel coronavirus. I discussed the limitations, risks, security and privacy concerns of performing an evaluation and management service by MyChart and the availability of in person appointments. I also discussed with the patient that there may be a patient responsible charge related to this service. By engaging in this virtual visit, you consent to the provision of healthcare.  Additionally, you authorize for your insurance to be billed for the services provided during this visit.  The patient expressed understanding and agreed to proceed.  The following staff members participated in the virtual visit:  Corinda Gubler, CMA and Nolene Bernheim, NP    PRENATAL VISIT NOTE  Subjective:  Brianna Bender is a 19 y.o. G2P0010 at [redacted]w[redacted]d  for phone visit for ongoing prenatal care.  She is currently monitored for the following issues for this low-risk pregnancy and has Asthma exacerbation; MDD (major depressive disorder), recurrent severe, without psychosis (HCC); Suicidal ideation; Supervision of low-risk pregnancy; Late prenatal care affecting pregnancy in second trimester; and Sickle cell trait (HCC) on their problem list.  Patient reports no complaints.  Contractions: Not present. Vag. Bleeding: None.  Movement: Present. Denies leaking of fluid.   The following portions of the patient's history were reviewed and updated as appropriate: allergies, current medications, past family history, past medical history, past social history, past surgical history and problem list.   Objective:  There were no vitals filed for this visit. Self-Obtained    Fetal Status:     Movement: Present     Assessment and Plan:  Pregnancy:  G2P0010 at [redacted]w[redacted]d 1. Encounter for supervision of low-risk pregnancy in second trimester See info below. Reviewed choosing a pediatrician - list on baby scripts Declines flu vaccine Reviewed BPs listed in Baby Scripts - normal readings.  2. Sickle cell trait (HCC) States FOB was tested as a child and is not a carrier for sickle cell trait.  Has not had genetic counseling.  3. Late prenatal care affecting pregnancy in second trimester Doing well. Encouraged to enroll in online childbirth and breastfeeding classes.  4. MDD (major depressive disorder), recurrent severe, without psychosis (HCC) Was on medication for about 3 months for depression about 3 years ago.  Did go to counseling for awhile but has stopped.  Will refer for [facility] visit in the office.  Client is aware she is at higher risk of postpartum depression due to history of depression.  Preterm labor symptoms and general obstetric precautions including but not limited to vaginal bleeding, contractions, leaking of fluid and fetal movement were reviewed in detail with the patient.  Return in about 2 weeks (around 04/01/2019) for Mychart ROB.  Future Appointments  Date Time Provider Department Center  04/01/2019 12:45 PM [facility] [place] 5 [facility]  04/03/2019 10:15 AM Rasch, Harolyn Rutherford, NP [facility]     Time spent on virtual visit: 12  minutes  Currie Paris, NP

## 2019-03-19 NOTE — BH Specialist Note (Signed)
Integrated Behavioral Health via Telemedicine Video Visit  03/19/2019 Brianna Bender [id number]  Number of [place] visits: 1 Session Start time: 1:51  Session End time: 2:40 Total time: 48  Referring Provider: Earlie Server, NP Type of Visit: Video Patient/Family location: Home [facility] Provider location: [facility] All persons participating in visit: Patient Brianna Bender and [name]    Confirmed patient's address: Yes  Confirmed patient's phone number: Yes  Any changes to demographics: No   Confirmed patient's insurance: Yes  Any changes to patient's insurance: No   Discussed confidentiality: Yes   I connected with [name] by a video enabled telemedicine application and verified that I am speaking with the correct person using two identifiers.     I discussed the limitations of evaluation and management by telemedicine and the availability of in person appointments.  I discussed that the purpose of this visit is to provide behavioral health care while limiting exposure to the novel coronavirus.   Discussed there is a possibility of technology failure and discussed alternative modes of communication if that failure occurs.  I discussed that engaging in this video visit, they consent to the provision of behavioral healthcare and the services will be billed under their insurance.  Patient and/or legal guardian expressed understanding and consented to video visit: Yes   PRESENTING CONCERNS: Patient and/or family reports the following symptoms/concerns: Pt states her primary concern today is worry about childbirth, and her goals are to be prepared for childbirth and reduce risk of depression and anxiety through pregnancy and postpartum. Pt falls asleep about 5am while watching TV(4-8hours/night), with fatigue and poor appetite, and having much nausea the past 2-3 weeks.  Duration of problem: Current pregnancy; Severity of problem: moderate  STRENGTHS  (Protective Factors/Coping Skills): Open to learn; supportive mother  GOALS ADDRESSED: Patient will: 1.  Reduce symptoms of: anxiety and depression  2.  Increase knowledge and/or ability of: healthy habits and stress reduction  3.  Demonstrate ability to: Increase healthy adjustment to current life circumstances, Increase adequate support systems for patient/family and Increase motivation to adhere to plan of care  INTERVENTIONS: Interventions utilized:  Solution-Focused Strategies and Psychoeducation and/or Health Education Standardized Assessments completed: Not given today  ASSESSMENT: Patient currently experiencing Adjustment disorder with anxiety.   Patient may benefit from psychoeducation and brief therapeutic interventions regarding coping with symptoms of anxiety and preventing depression .  PLAN: 1. Follow up with behavioral health clinician on : Two weeks 2. Behavioral recommendations:  Development worker, international aid of [facility] at [email or web address] -Sign up for childbirth education class at [email or web address] -Continue taking prenatal vitamin -Consider taking melatonin, as recommended by medical provider, an hour before bedtime -Consider closing eyes "for just 5 minutes" at night when feeling tired/yawning while watching TV -Consider using sleep sounds at night, in addition to TV, to see if it is helpful with sleep -Read through Postpartum Planner in After Visit Summary 3. Referral(s): [facility] (In Clinic)  I discussed the assessment and treatment plan with the patient and/or parent/guardian. They were provided an opportunity to ask questions and all were answered. They agreed with the plan and demonstrated an understanding of the instructions.   They were advised to call back or seek an in-person evaluation if the symptoms worsen or if the condition fails to improve as anticipated.  [name]  Depression  screen [facility] [place] 2/9 02/25/2019 02/12/2019  Decreased Interest 1 0  Down, Depressed, Hopeless 0 0  PHQ -  2 Score 1 0  Altered sleeping 1 0  Tired, decreased energy 0 0  Change in appetite 0 3  Feeling bad or failure about yourself  0 0  Trouble concentrating 0 0  Moving slowly or fidgety/restless 0 0  Suicidal thoughts 0 0  PHQ-9 Score 2 3  ' GAD 7 : Generalized Anxiety Score 02/25/2019 02/12/2019  Nervous, Anxious, on Edge 2 0  Control/stop worrying 2 1  Worry too much - different things 2 0  Trouble relaxing 2 0  Restless 0 0  Easily annoyed or irritable 2 2  Afraid - awful might happen 0 2  Total GAD 7 Score 10 5

## 2019-03-21 ENCOUNTER — Other Ambulatory Visit: Payer: Self-pay | Admitting: Nurse Practitioner

## 2019-03-21 ENCOUNTER — Ambulatory Visit: Payer: Medicaid Other | Admitting: Clinical

## 2019-03-21 DIAGNOSIS — F4322 Adjustment disorder with anxiety: Secondary | ICD-10-CM

## 2019-03-21 DIAGNOSIS — Z8659 Personal history of other mental and behavioral disorders: Secondary | ICD-10-CM

## 2019-03-21 MED ORDER — DOCUSATE CALCIUM 240 MG PO CAPS: 240.0000 mg | ORAL_CAPSULE | Freq: Every day | ORAL | 2 refills | Status: DC

## 2019-03-21 MED ORDER — ONDANSETRON HCL 4 MG PO TABS: 4.0000 mg | ORAL_TABLET | Freq: Three times a day (TID) | ORAL | 1 refills | Status: DC | PRN

## 2019-03-21 NOTE — Progress Notes (Signed)
Brianna Bender 18 y.o. [redacted]w[redacted]d Having increase in nausea and requesting medication.  Zofran and stool softener sent to her pharmacy and MyChart message to client.  Nolene Bernheim, RN, MSN, NP-BC Nurse Practitioner, [facility] for [employer], [facility] 03/21/2019 7:52 PM

## 2019-04-01 ENCOUNTER — Ambulatory Visit (HOSPITAL_COMMUNITY): Payer: BLUE CROSS/BLUE SHIELD

## 2019-04-01 NOTE — BH Specialist Note (Deleted)
Integrated Behavioral Health via Telemedicine Video Visit  04/01/2019 Marianna Cid [id number]  Number of Integrated Behavioral Health visits: 2 Session Start time: 10:45***  Session End time: 11:15*** Total time: {IBH Total QDIY:64158309}  Referring Provider: Nolene Bernheim, NP Type of Visit: Video Patient/Family location: Home [facility] Provider location: [facility] All persons participating in visit: Patient *** and [facility] ***   Confirmed patient's address: Yes  Confirmed patient's phone number: Yes  Any changes to demographics: No   Confirmed patient's insurance: Yes  Any changes to patient's insurance: No   Discussed confidentiality: at previous visit  I connected with Caryl Bis by a video enabled telemedicine application and verified that I am speaking with the correct person using two identifiers.     I discussed the limitations of evaluation and management by telemedicine and the availability of in person appointments.  I discussed that the purpose of this visit is to provide behavioral health care while limiting exposure to the novel coronavirus.   Discussed there is a possibility of technology failure and discussed alternative modes of communication if that failure occurs.  I discussed that engaging in this video visit, they consent to the provision of behavioral healthcare and the services will be billed under their insurance.  Patient and/or legal guardian expressed understanding and consented to video visit: Yes   PRESENTING CONCERNS: Patient and/or family reports the following symptoms/concerns: *** Duration of problem: Current pregnancy; Severity of problem: {Mild/Moderate/Severe:20260}  STRENGTHS (Protective Factors/Coping Skills): Supportive mother ***  GOALS ADDRESSED: Patient will: 1.  Reduce symptoms of: anxiety and depression  2.  Increase knowledge and/or ability of: {IBH Patient Tools:21014057}  3.  Demonstrate ability to: {IBH  Goals:21014053}  INTERVENTIONS: Interventions utilized:  {IBH Interventions:21014054} Standardized Assessments completed: {IBH Screening Tools:21014051}  ASSESSMENT: Patient currently experiencing Adjustment disorder with anxiety.   Patient may benefit from continued psychoeducation and brief therapeutic interventions regarding coping with symptoms of *** .  PLAN: 1. Follow up with behavioral health clinician on : *** 2. Behavioral recommendations:  -*** -***(virtual tour, childbirth ed, prenatal vit, melatonin 1hr before bed, close yes "just 5 min" when tired, sleep sounds w tv, pp planner)*** 3. Referral(s): {IBH Referrals:21014055}  I discussed the assessment and treatment plan with the patient and/or parent/guardian. They were provided an opportunity to ask questions and all were answered. They agreed with the plan and demonstrated an understanding of the instructions.   They were advised to call back or seek an in-person evaluation if the symptoms worsen or if the condition fails to improve as anticipated.  Valetta Close Ewan Grau

## 2019-04-03 ENCOUNTER — Other Ambulatory Visit: Payer: Self-pay

## 2019-04-03 ENCOUNTER — Telehealth (INDEPENDENT_AMBULATORY_CARE_PROVIDER_SITE_OTHER): Payer: BLUE CROSS/BLUE SHIELD | Admitting: Obstetrics and Gynecology

## 2019-04-03 VITALS — BP 117/67 | HR 72

## 2019-04-03 DIAGNOSIS — H5203 Hypermetropia, bilateral: Secondary | ICD-10-CM | POA: Diagnosis not present

## 2019-04-03 DIAGNOSIS — Z3A32 32 weeks gestation of pregnancy: Secondary | ICD-10-CM

## 2019-04-03 DIAGNOSIS — O0932 Supervision of pregnancy with insufficient antenatal care, second trimester: Secondary | ICD-10-CM

## 2019-04-03 DIAGNOSIS — O0933 Supervision of pregnancy with insufficient antenatal care, third trimester: Secondary | ICD-10-CM

## 2019-04-03 DIAGNOSIS — D573 Sickle-cell trait: Secondary | ICD-10-CM

## 2019-04-03 DIAGNOSIS — Z3493 Encounter for supervision of normal pregnancy, unspecified, third trimester: Secondary | ICD-10-CM

## 2019-04-03 DIAGNOSIS — H538 Other visual disturbances: Secondary | ICD-10-CM | POA: Diagnosis not present

## 2019-04-03 DIAGNOSIS — H52223 Regular astigmatism, bilateral: Secondary | ICD-10-CM | POA: Diagnosis not present

## 2019-04-03 DIAGNOSIS — R519 Headache, unspecified: Secondary | ICD-10-CM | POA: Insufficient documentation

## 2019-04-03 MED ORDER — PROMETHAZINE HCL 25 MG PO TABS: 25.0000 mg | ORAL_TABLET | Freq: Four times a day (QID) | ORAL | 1 refills | Status: DC | PRN

## 2019-04-03 MED ORDER — CYCLOBENZAPRINE HCL 10 MG PO TABS: 10.0000 mg | ORAL_TABLET | Freq: Three times a day (TID) | ORAL | 1 refills | Status: DC | PRN

## 2019-04-03 MED ORDER — PRENATAL GUMMIES/DHA & FA 0.4-32.5 MG PO CHEW: 1.0000 | CHEWABLE_TABLET | Freq: Every day | ORAL | 3 refills | Status: DC

## 2019-04-03 MED ORDER — DOCUSATE CALCIUM 240 MG PO CAPS: 240.0000 mg | ORAL_CAPSULE | Freq: Every day | ORAL | 2 refills | Status: DC

## 2019-04-03 MED ORDER — ONDANSETRON HCL 4 MG PO TABS: 4.0000 mg | ORAL_TABLET | Freq: Three times a day (TID) | ORAL | 1 refills | Status: DC | PRN

## 2019-04-03 NOTE — Progress Notes (Signed)
I connected with  Brianna Bender on 04/03/19 at 10:15 AM EST by telephone and verified that I am speaking with the correct person using two identifiers.   I discussed the limitations, risks, security and privacy concerns of performing an evaluation and management service by telephone and the availability of in person appointments. I also discussed with the patient that there may be a patient responsible charge related to this service. The patient expressed understanding and agreed to proceed.  Janene Madeira Kona Yusuf, CMA 04/03/2019  10:12 AM

## 2019-04-03 NOTE — Patient Instructions (Signed)
Contraception Choices Contraception, also called birth control, refers to methods or devices that prevent pregnancy. Hormonal methods Contraceptive implant  A contraceptive implant is a thin, plastic tube that contains a hormone. It is inserted into the upper part of the arm. It can remain in place for up to 3 years. Progestin-only injections Progestin-only injections are injections of progestin, a synthetic form of the hormone progesterone. They are given every 3 months by a health care provider. Birth control pills  Birth control pills are pills that contain hormones that prevent pregnancy. They must be taken once a day, preferably at the same time each day. Birth control patch  The birth control patch contains hormones that prevent pregnancy. It is placed on the skin and must be changed once a week for three weeks and removed on the fourth week. A prescription is needed to use this method of contraception. Vaginal ring  A vaginal ring contains hormones that prevent pregnancy. It is placed in the vagina for three weeks and removed on the fourth week. After that, the process is repeated with a new ring. A prescription is needed to use this method of contraception. Emergency contraceptive Emergency contraceptives prevent pregnancy after unprotected sex. They come in pill form and can be taken up to 5 days after sex. They work best the sooner they are taken after having sex. Most emergency contraceptives are available without a prescription. This method should not be used as your only form of birth control. Barrier methods Female condom  A female condom is a thin sheath that is worn over the penis during sex. Condoms keep sperm from going inside a woman's body. They can be used with a spermicide to increase their effectiveness. They should be disposed after a single use. Female condom  A female condom is a soft, loose-fitting sheath that is put into the vagina before sex. The condom keeps sperm  from going inside a woman's body. They should be disposed after a single use. Diaphragm  A diaphragm is a soft, dome-shaped barrier. It is inserted into the vagina before sex, along with a spermicide. The diaphragm blocks sperm from entering the uterus, and the spermicide kills sperm. A diaphragm should be left in the vagina for 6-8 hours after sex and removed within 24 hours. A diaphragm is prescribed and fitted by a health care provider. A diaphragm should be replaced every 1-2 years, after giving birth, after gaining more than 15 lb (6.8 kg), and after pelvic surgery. Cervical cap  A cervical cap is a round, soft latex or plastic cup that fits over the cervix. It is inserted into the vagina before sex, along with spermicide. It blocks sperm from entering the uterus. The cap should be left in place for 6-8 hours after sex and removed within 48 hours. A cervical cap must be prescribed and fitted by a health care provider. It should be replaced every 2 years. Sponge  A sponge is a soft, circular piece of polyurethane foam with spermicide on it. The sponge helps block sperm from entering the uterus, and the spermicide kills sperm. To use it, you make it wet and then insert it into the vagina. It should be inserted before sex, left in for at least 6 hours after sex, and removed and thrown away within 30 hours. Spermicides Spermicides are chemicals that kill or block sperm from entering the cervix and uterus. They can come as a cream, jelly, suppository, foam, or tablet. A spermicide should be inserted into the   vagina with an applicator at least 10-15 minutes before sex to allow time for it to work. The process must be repeated every time you have sex. Spermicides do not require a prescription. Intrauterine contraception Intrauterine device (IUD) An IUD is a T-shaped device that is put in a woman's uterus. There are two types:  Hormone IUD.This type contains progestin, a synthetic form of the hormone  progesterone. This type can stay in place for 3-5 years.  Copper IUD.This type is wrapped in copper wire. It can stay in place for 10 years.  Permanent methods of contraception Female tubal ligation In this method, a woman's fallopian tubes are sealed, tied, or blocked during surgery to prevent eggs from traveling to the uterus. Hysteroscopic sterilization In this method, a small, flexible insert is placed into each fallopian tube. The inserts cause scar tissue to form in the fallopian tubes and block them, so sperm cannot reach an egg. The procedure takes about 3 months to be effective. Another form of birth control must be used during those 3 months. Female sterilization This is a procedure to tie off the tubes that carry sperm (vasectomy). After the procedure, the man can still ejaculate fluid (semen). Natural planning methods Natural family planning In this method, a couple does not have sex on days when the woman could become pregnant. Calendar method This means keeping track of the length of each menstrual cycle, identifying the days when pregnancy can happen, and not having sex on those days. Ovulation method In this method, a couple avoids sex during ovulation. Symptothermal method This method involves not having sex during ovulation. The woman typically checks for ovulation by watching changes in her temperature and in the consistency of cervical mucus. Post-ovulation method In this method, a couple waits to have sex until after ovulation. Summary  Contraception, also called birth control, means methods or devices that prevent pregnancy.  Hormonal methods of contraception include implants, injections, pills, patches, vaginal rings, and emergency contraceptives.  Barrier methods of contraception can include female condoms, female condoms, diaphragms, cervical caps, sponges, and spermicides.  There are two types of IUDs (intrauterine devices). An IUD can be put in a woman's uterus to  prevent pregnancy for 3-5 years.  Permanent sterilization can be done through a procedure for males, females, or both.  Natural family planning methods involve not having sex on days when the woman could become pregnant. This information is not intended to replace advice given to you by your health care provider. Make sure you discuss any questions you have with your health care provider. Document Revised: 02/02/2017 Document Reviewed: 03/05/2016 Elsevier Patient Education  2020 [employer]  

## 2019-04-03 NOTE — Progress Notes (Signed)
TELEHEALTH OBSTETRICS PRENATAL VIRTUAL VIDEO VISIT ENCOUNTER NOTE  Provider location: Center for [facility]   I connected with Brianna Bender on 04/03/19 at 10:15 AM EST by WebEx Video Encounter at home and verified that I am speaking with the correct person using two identifiers.   I discussed the limitations, risks, security and privacy concerns of performing an evaluation and management service virtually and the availability of in person appointments. I also discussed with the patient that there may be a patient responsible charge related to this service. The patient expressed understanding and agreed to proceed. Subjective:  Brianna Bender is a 19 y.o. G2P0010 at [redacted]w[redacted]d being seen today for ongoing prenatal care.  She is currently monitored for the following issues for this low-risk pregnancy and has Asthma exacerbation; MDD (major depressive disorder), recurrent severe, without psychosis (HCC); Suicidal ideation; Supervision of low-risk pregnancy; Late prenatal care affecting pregnancy in second trimester; and Sickle cell trait (HCC) on their problem list.  Patient reports headache.  Contractions: Not present. Vag. Bleeding: None.  Movement: Present. Denies any leaking of fluid.   The following portions of the patient's history were reviewed and updated as appropriate: allergies, current medications, past family history, past medical history, past social history, past surgical history and problem list.   Objective:   Vitals:   04/03/19 1013  BP: 117/67  Pulse: 72    Fetal Status:     Movement: Present     General:  Alert, oriented and cooperative. Patient is in no acute distress.  Respiratory: Normal respiratory effort, no problems with respiration noted  Mental Status: Normal mood and affect. Normal behavior. Normal judgment and thought content.  Rest of physical exam deferred due to type of encounter  Imaging: [place] MFM OB DETAIL +14 WK  Result Date: 03/04/2019  ----------------------------------------------------------------------  OBSTETRICS REPORT                       (Signed Final 03/04/2019 04:44 pm) ---------------------------------------------------------------------- Patient Info  ID #:       [id number]                          D.O.B.:  27-Jul-2000 (18 yrs)  Name:       Brianna Bender                    Visit Date: 03/04/2019 02:56 pm ---------------------------------------------------------------------- Performed By  Performed By:     Lenise Arena        Ref. Address:     [address]                    [address]                                                             [address]                                                             [place]  [id number]  Attending:        Ma Rings MD         [facility]  Referred By:      Levie Heritage                    MD ---------------------------------------------------------------------- Orders   #  Description                          Code         Ordered By   1  [place] MFM OB DETAIL +14 WK              76811.01     Candelaria Celeste  ----------------------------------------------------------------------   #  Order #                    Accession #                 Episode #   1  [id number]                  [id number]                  [id number]  ---------------------------------------------------------------------- Indications   Encounter for antenatal screening for          Z36.3   malformations (NIPS drawn)   Sickle cell trait                              Z86.2   Obesity complicating pregnancy, second         O99.212   trimester (BMI 37)   Asthma                                         O99.89 j45.909   [redacted] weeks gestation of pregnancy                Z3A.27  ---------------------------------------------------------------------- Vital Signs  Weight (lb): 210                                Height:        5'3"  BMI:         37.2 ---------------------------------------------------------------------- Fetal Evaluation  Num Of Fetuses:         1  Fetal Heart Rate(bpm):  128  Cardiac Activity:       Observed  Presentation:           Cephalic  Placenta:               Anterior  P. Cord Insertion:      Visualized, central  Amniotic Fluid  AFI FV:      Within normal limits  Largest Pocket(cm)                              4.5 ---------------------------------------------------------------------- Biometry  BPD:      68.6  mm     G. Age:  27w 4d         34  %    CI:        71.56   %    70 - 86                                                          FL/HC:      20.1   %    18.8 - 20.6  HC:      258.2  mm     G. Age:  28w 0d         30  %    HC/AC:      1.14        1.05 - 1.21  AC:      225.7  mm     G. Age:  27w 0d         21  %    FL/BPD:     75.7   %    71 - 87  FL:       51.9  mm     G. Age:  27w 5d         34  %    FL/AC:      23.0   %    20 - 24  CER:      33.2  mm     G. Age:  29w 1d         68  %  LV:          3  mm  CM:        3.9  mm  Est. FW:    1065  gm      2 lb 6 oz     25  % ---------------------------------------------------------------------- OB History  Gravidity:    2  TOP:          1        Living:  0 ---------------------------------------------------------------------- Gestational Age  LMP:           27w 5d        Date:  08/22/18                 EDD:   05/29/19  U/S Today:     27w 4d                                        EDD:   05/30/19  Best:          27w 5d     Det. By:  LMP  (08/22/18)          EDD:   05/29/19 ---------------------------------------------------------------------- Anatomy  Cranium:               Appears normal         Aortic Arch:            Not well visualized  Cavum:  Appears normal         Ductal Arch:            Not well visualized  Ventricles:            Appears normal         Diaphragm:              Appears normal   Choroid Plexus:        Appears normal         Stomach:                Appears normal, left                                                                        sided  Cerebellum:            Appears normal         Abdomen:                Appears normal  Posterior Fossa:       Appears normal         Abdominal Wall:         Appears nml (cord                                                                        insert, abd wall)  Nuchal Fold:           Not applicable (>20    Cord Vessels:           Appears normal ([redacted]                         wks GA)                                        vessel cord)  Face:                  Not well visualized    Kidneys:                Appear normal  Lips:                  Appears normal         Bladder:                Appears normal  Thoracic:              Appears normal         Spine:                  Ltd views no  intracranial signs of                                                                        NTD  Heart:                 Not well visualized    Upper Extremities:      Visualized  RVOT:                  Appears normal         Lower Extremities:      Appears normal  LVOT:                  Appears normal  Other:  Technically difficult due to maternal habitus and fetal position. ---------------------------------------------------------------------- Cervix Uterus Adnexa  Cervix  Not visualized (advanced GA >24wks) ---------------------------------------------------------------------- Comments  This patient was seen for a detailed fetal anatomy scan.  The fetal growth and amniotic fluid level were appropriate for  her gestational age.  The views of the fetal anatomy were limited today due to her  advanced gestational age.  The patient was informed that anomalies may be missed due  to technical limitations. If the fetus is in a suboptimal position  or maternal habitus is increased, visualization of the fetus  in  the maternal uterus may be impaired.  A follow up exam was scheduled in 4 weeks to complete the  views of the fetal anatomy. ----------------------------------------------------------------------                   Johnell Comings, MD Electronically Signed Final Report   03/04/2019 04:44 pm ----------------------------------------------------------------------   Assessment and Plan:  Pregnancy: G2P0010 at [redacted]w[redacted]d  1. Sickle cell trait ([place])  - Needs urine culture this trimester.   2. Late prenatal care affecting pregnancy in second trimester   3. Encounter for supervision of low-risk pregnancy in third trimester  - Discussed birth control in detail, ok with pills. Would consider nexplanon however she is scared of side effects.   BP good today .  Preterm labor symptoms and general obstetric precautions including but not limited to vaginal bleeding, contractions, leaking of fluid and fetal movement were reviewed in detail with the patient. I discussed the assessment and treatment plan with the patient. The patient was provided an opportunity to ask questions and all were answered. The patient agreed with the plan and demonstrated an understanding of the instructions. The patient was advised to call back or seek an in-person office evaluation/go to [facility] at [facility] for any urgent or concerning symptoms. Please refer to After Visit Summary for other counseling recommendations.   I provided 10 minutes of face-to-face time during this encounter.  No follow-ups on file.  Future Appointments  Date Time Provider [place]  04/04/2019 10:45 AM [place]  04/04/2019  3:45 PM [place], NP Center for [employer], [place]

## 2019-04-04 ENCOUNTER — Ambulatory Visit (HOSPITAL_COMMUNITY): Payer: BLUE CROSS/BLUE SHIELD

## 2019-04-04 ENCOUNTER — Ambulatory Visit: Payer: Medicaid Other

## 2019-04-10 ENCOUNTER — Other Ambulatory Visit: Payer: Self-pay

## 2019-04-10 ENCOUNTER — Ambulatory Visit (HOSPITAL_COMMUNITY)
Admission: RE | Admit: 2019-04-10 | Discharge: 2019-04-10 | Disposition: A | Discharge status: Home / Self Care | Payer: BLUE CROSS/BLUE SHIELD | Source: Ambulatory Visit | Attending: Obstetrics | Admitting: Obstetrics

## 2019-04-10 DIAGNOSIS — O99213 Obesity complicating pregnancy, third trimester: Secondary | ICD-10-CM

## 2019-04-10 DIAGNOSIS — Z3A33 33 weeks gestation of pregnancy: Secondary | ICD-10-CM | POA: Diagnosis not present

## 2019-04-10 DIAGNOSIS — J45909 Unspecified asthma, uncomplicated: Secondary | ICD-10-CM | POA: Diagnosis not present

## 2019-04-10 DIAGNOSIS — Z362 Encounter for other antenatal screening follow-up: Secondary | ICD-10-CM | POA: Diagnosis not present

## 2019-04-10 DIAGNOSIS — O99891 Other specified diseases and conditions complicating pregnancy: Secondary | ICD-10-CM

## 2019-04-16 ENCOUNTER — Ambulatory Visit (INDEPENDENT_AMBULATORY_CARE_PROVIDER_SITE_OTHER): Payer: BLUE CROSS/BLUE SHIELD | Admitting: Clinical

## 2019-04-16 ENCOUNTER — Other Ambulatory Visit: Payer: Self-pay

## 2019-04-16 DIAGNOSIS — F4322 Adjustment disorder with anxiety: Secondary | ICD-10-CM

## 2019-04-16 NOTE — BH Specialist Note (Signed)
Integrated Behavioral Health via Telemedicine Video Visit  04/16/2019 Brianna Bender [id number]  Number of Integrated Behavioral Health visits: 2 Session Start time: 2:23  Session End time: 2:55 Total time: 32  Referring Provider: Nolene Bernheim, NP Type of Visit: Video Patient/Family location: Home [facility] Provider location: [facility] All persons participating in visit: Patient Brianna Bender and [facility] Florence Yeung    Confirmed patient's address: Yes  Confirmed patient's phone number: Yes  Any changes to demographics: No   Confirmed patient's insurance: Yes  Any changes to patient's insurance: No   Discussed confidentiality: at previous visit  I connected with Brianna Bender a video enabled telemedicine application and verified that I am speaking with the correct person using two identifiers.     I discussed the limitations of evaluation and management by telemedicine and the availability of in person appointments.  I discussed that the purpose of this visit is to provide behavioral health care while limiting exposure to the novel coronavirus.   Discussed there is a possibility of technology failure and discussed alternative modes of communication if that failure occurs.  I discussed that engaging in this video visit, they consent to the provision of behavioral healthcare and the services will be billed under their insurance.  Patient and/or legal guardian expressed understanding and consented to video visit: Yes   PRESENTING CONCERNS: Patient and/or family reports the following symptoms/concerns: Pt states her primary concern today is feeling most anxious specifically when she sleeps "between my back and side" with "pressure on my chest, like my heart is being squeezed and beating hard in my chest", along with continued worry about childbirth.  Duration of problem: Current pregnancy; Severity of problem: moderate  STRENGTHS (Protective Factors/Coping Skills): Supportive husband and  mother  GOALS ADDRESSED: Patient will: 1.  Reduce symptoms of: anxiety and stress  2.  Increase knowledge and/or ability of: healthy habits and stress reduction  3.  Demonstrate ability to: Increase healthy adjustment to current life circumstances and Increase adequate support systems for patient/family  INTERVENTIONS: Interventions utilized:  Supportive Counseling, Psychoeducation and/or Health Education and Link to [employer] Standardized Assessments completed: GAD-7 and PHQ 9  ASSESSMENT: Patient currently experiencing Adjustment disorder with anxiety.   Patient may benefit from continued psychoeducation and brief therapeutic interventions regarding coping with symptoms of anxiety  .  PLAN: 1. Follow up with behavioral health clinician on : Two weeks 2. Behavioral recommendations:  -Sign up for childbirth education classes at [email or web address]; attend with husband -Continue taking prenatal vitamin daily -Consider taking melatonin, as recommended by medical provider, one hour before bedtime, as needed -Continue using sleep and TV sounds at night -Discuss medical concerns with Dr. Vergie Living at visit tomorrow 3. Referral(s): Integrated [employer] (In Clinic)  I discussed the assessment and treatment plan with the patient and/or parent/guardian. They were provided an opportunity to ask questions and all were answered. They agreed with the plan and demonstrated an understanding of the instructions.   They were advised to call back or seek an in-person evaluation if the symptoms worsen or if the condition fails to improve as anticipated.  Valetta Close Brianna Bender

## 2019-04-17 ENCOUNTER — Other Ambulatory Visit: Payer: BLUE CROSS/BLUE SHIELD

## 2019-04-17 ENCOUNTER — Other Ambulatory Visit: Payer: Self-pay

## 2019-04-17 ENCOUNTER — Telehealth (INDEPENDENT_AMBULATORY_CARE_PROVIDER_SITE_OTHER): Payer: BLUE CROSS/BLUE SHIELD | Admitting: Obstetrics and Gynecology

## 2019-04-17 VITALS — BP 122/84 | HR 82

## 2019-04-17 DIAGNOSIS — O0933 Supervision of pregnancy with insufficient antenatal care, third trimester: Secondary | ICD-10-CM

## 2019-04-17 DIAGNOSIS — Z3493 Encounter for supervision of normal pregnancy, unspecified, third trimester: Secondary | ICD-10-CM

## 2019-04-17 DIAGNOSIS — O0932 Supervision of pregnancy with insufficient antenatal care, second trimester: Secondary | ICD-10-CM

## 2019-04-17 DIAGNOSIS — D573 Sickle-cell trait: Secondary | ICD-10-CM

## 2019-04-17 DIAGNOSIS — J45909 Unspecified asthma, uncomplicated: Secondary | ICD-10-CM | POA: Insufficient documentation

## 2019-04-17 DIAGNOSIS — Z3A34 34 weeks gestation of pregnancy: Secondary | ICD-10-CM

## 2019-04-17 DIAGNOSIS — O99513 Diseases of the respiratory system complicating pregnancy, third trimester: Secondary | ICD-10-CM

## 2019-04-17 MED ORDER — ALBUTEROL SULFATE HFA 108 (90 BASE) MCG/ACT IN AERS: 1.0000 | INHALATION_SPRAY | Freq: Four times a day (QID) | RESPIRATORY_TRACT | 1 refills | Status: AC | PRN

## 2019-04-17 MED ORDER — MONTELUKAST SODIUM 10 MG PO TABS: 10.0000 mg | ORAL_TABLET | Freq: Every day | ORAL | 1 refills | Status: DC

## 2019-04-17 MED ORDER — FAMOTIDINE 20 MG PO TABS: 20.0000 mg | ORAL_TABLET | Freq: Two times a day (BID) | ORAL | 1 refills | Status: DC

## 2019-04-17 NOTE — Progress Notes (Signed)
I connected with  Brianna Bender on 04/17/19 at 1010 by telephone and verified that I am speaking with the correct person using two identifiers.   I discussed the limitations, risks, security and privacy concerns of performing an evaluation and management service by telephone and the availability of in person appointments. I also discussed with the patient that there may be a patient responsible charge related to this service. The patient expressed understanding and agreed to proceed.  Positive PHQ-9; pt has follow-up appt with [facility] Jamie.   Marjo Bicker, RN 04/17/2019  10:10 AM

## 2019-04-17 NOTE — Progress Notes (Signed)
   TELEHEALTH VIRTUAL OBSTETRICS VISIT ENCOUNTER NOTE  Clinic: [facility]  I connected with Brianna Bender on 04/17/19 at 21:55 AM EST by telephone at home and verified that I am speaking with the correct person using two identifiers.   I discussed the limitations, risks, security and privacy concerns of performing an evaluation and management service by telephone and the availability of in person appointments. I also discussed with the patient that there may be a patient responsible charge related to this service. The patient expressed understanding and agreed to proceed.  Subjective:  Brianna Bender is a 19 y.o. G2P0010 at [redacted]w[redacted]d being followed for ongoing prenatal care.  She is currently monitored for the following issues for this low-risk pregnancy and has Asthma exacerbation; MDD (major depressive disorder), recurrent severe, without psychosis (HCC); Suicidal ideation; Supervision of low-risk pregnancy; Late prenatal care affecting pregnancy in second trimester; Sickle cell trait (HCC); Frequent headaches; and Asthma affecting pregnancy in third trimester on their problem list.  Patient reports no complaints. Reports fetal movement. Denies any contractions, bleeding or leaking of fluid.   The following portions of the patient's history were reviewed and updated as appropriate: allergies, current medications, past family history, past medical history, past social history, past surgical history and problem list.   Objective:   Vitals:   04/17/19 1010 04/17/19 1027  BP: 131/88 122/84  Pulse: 81 82    Babyscripts Data Reviewed: yes  General:  Alert, oriented and cooperative.   Mental Status: Normal mood and affect perceived. Normal judgment and thought content.  Rest of physical exam deferred due to type of encounter  Assessment and Plan:  Pregnancy: G2P0010 at [redacted]w[redacted]d 1. Sickle cell trait (HCC) No issues Urine culture nv  2. Late prenatal care affecting  pregnancy in second trimester  3. Encounter for supervision of low-risk pregnancy in third trimester Gbs, hiv, rpr, cbc nv  4. Asthma affecting pregnancy in third trimester Had to use albuterol once recently. Has seasonal allergies and gerd Albuterol, pepcid and singulair sent in  Preterm labor symptoms and general obstetric precautions including but not limited to vaginal bleeding, contractions, leaking of fluid and fetal movement were reviewed in detail with the patient.  I discussed the assessment and treatment plan with the patient. The patient was provided an opportunity to ask questions and all were answered. The patient agreed with the plan and demonstrated an understanding of the instructions. The patient was advised to call back or seek an in-person office evaluation/go to [facility]U at [facility] for any urgent or concerning symptoms. Please refer to After Visit Summary for other counseling recommendations.   I provided 7 minutes of non-face-to-face time during this encounter. The visit was conducted via MyChart-medicine  Return in about 2 weeks (around 05/01/2019) for low risk, in person.  Future Appointments  Date Time Provider Department Center  04/17/2019 10:55 AM [place] Bing, MD [facility]  04/30/2019  1:15 PM [facility]    [place] Bing, MD Center for [employer], [facility]

## 2019-05-02 ENCOUNTER — Ambulatory Visit (INDEPENDENT_AMBULATORY_CARE_PROVIDER_SITE_OTHER): Payer: BLUE CROSS/BLUE SHIELD | Admitting: Obstetrics and Gynecology

## 2019-05-02 ENCOUNTER — Other Ambulatory Visit: Payer: Self-pay

## 2019-05-02 VITALS — BP 131/86 | HR 85 | Wt 234.0 lb

## 2019-05-02 DIAGNOSIS — Z3A36 36 weeks gestation of pregnancy: Secondary | ICD-10-CM

## 2019-05-02 DIAGNOSIS — Z3493 Encounter for supervision of normal pregnancy, unspecified, third trimester: Secondary | ICD-10-CM

## 2019-05-02 DIAGNOSIS — Z113 Encounter for screening for infections with a predominantly sexual mode of transmission: Secondary | ICD-10-CM | POA: Diagnosis not present

## 2019-05-02 NOTE — Progress Notes (Signed)
   PRENATAL VISIT NOTE  Subjective:  Brianna Bender is a 19 y.o. G2P0010 at [redacted]w[redacted]d being seen today for ongoing prenatal care.  She is currently monitored for the following issues for this low-risk pregnancy and has Asthma exacerbation; MDD (major depressive disorder), recurrent severe, without psychosis (HCC); Suicidal ideation; Supervision of low-risk pregnancy; Late prenatal care affecting pregnancy in second trimester; Sickle cell trait (HCC); Frequent headaches; and Asthma affecting pregnancy in third trimester on their problem list.  Patient reports no complaints.  Contractions: Not present. Vag. Bleeding: None.  Movement: Present. Denies leaking of fluid.   The following portions of the patient's history were reviewed and updated as appropriate: allergies, current medications, past family history, past medical history, past social history, past surgical history and problem list.   Objective:   Vitals:   05/02/19 1538  BP: 131/86  Pulse: 85  Weight: 234 lb (106.1 kg)    Fetal Status: Fetal Heart Rate (bpm): 118 Fundal Height: 36 cm Movement: Present  Presentation: Vertex  General:  Alert, oriented and cooperative. Patient is in no acute distress.  Skin: Skin is warm and dry. No rash noted.   Cardiovascular: Normal heart rate noted  Respiratory: Normal respiratory effort, no problems with respiration noted  Abdomen: Soft, gravid, appropriate for gestational age.  Pain/Pressure: Present     Pelvic: Done Dilation: 1 Effacement (%): 50 Station: Ballotable Presentation: Vertex  Extremities: Normal range of motion.  Edema: Mild pitting, slight indentation  Mental Status: Normal mood and affect. Normal behavior. Normal judgment and thought content.   Assessment and Plan:  Pregnancy: G2P0010 at [redacted]w[redacted]d 1. Encounter for supervision of low-risk pregnancy in third trimester  - Doppler fetal heart tones myself. RN concerned about 118 bpm- doppler shows 120-133 bpm - Culture, beta strep  (group b only) - GC/Chlamydia probe amp ([place])not at [facility] - Swelling in bilateral feet- BP is ok.  Discussed importance of checking BP at home. Reviewed PIH precautions.    Preterm labor symptoms and general obstetric precautions including but not limited to vaginal bleeding, contractions, leaking of fluid and fetal movement were reviewed in detail with the patient. Please refer to After Visit Summary for other counseling recommendations.   Return in about 1 week (around 05/09/2019) for 1 week for in person visit. .  Future Appointments  Date Time Provider Department Center  05/03/2019  9:45 AM Gwyndolyn Saxon, LCSW [facility] None  05/09/2019  8:35 AM Akirra Lacerda, Harolyn Rutherford, NP [facility]  05/16/2019  2:35 PM Wynelle Dreier, Harolyn Rutherford, NP [facility]  05/23/2019  4:15 PM Zylan Almquist, Harolyn Rutherford, NP [facility]    Venia Carbon, NP

## 2019-05-03 ENCOUNTER — Ambulatory Visit (INDEPENDENT_AMBULATORY_CARE_PROVIDER_SITE_OTHER): Payer: BLUE CROSS/BLUE SHIELD | Admitting: Licensed Clinical Social Worker

## 2019-05-03 DIAGNOSIS — Z3A00 Weeks of gestation of pregnancy not specified: Secondary | ICD-10-CM

## 2019-05-03 DIAGNOSIS — F419 Anxiety disorder, unspecified: Secondary | ICD-10-CM

## 2019-05-03 DIAGNOSIS — O99340 Other mental disorders complicating pregnancy, unspecified trimester: Secondary | ICD-10-CM

## 2019-05-03 LAB — GC/CHLAMYDIA PROBE AMP (~~LOC~~) NOT AT ARMC
Chlamydia: NEGATIVE
Comment: NEGATIVE
Comment: NORMAL
Neisseria Gonorrhea: NEGATIVE

## 2019-05-03 NOTE — BH Specialist Note (Signed)
Integrated Behavioral Health Follow Up Visit  MRN: [id number] Name: Brianna Bender  Number of Integrated Behavioral Health Clinician visits: 3 Session Start time: 9:45am  Session End time: 10:15am Total time: 30 minutes   Type of Service: Integrated Behavioral Health- Individual Interpretor:No  Interpretor Name and Language: None   SUBJECTIVE: Brianna Bender is a 19 y.o. female  Patient was referred by Lilyan Punt NP for depression Patient reports the following symptoms/concerns: stress regarding labor  Duration of problem:current pregnancy ; Severity of problem: mild  OBJECTIVE: Mood: Good  and Affect: Congruent  Risk of harm to self or others: No risk of harm to self or others   LIFE CONTEXT: Family and Social: reports strong support system  School/Work: n/a Self-Care: n/a Life Changes: New pregnancy   GOALS ADDRESSED: Patient will: 1.  Reduce symptoms of: stress and anxiety   2.  Increase knowledge and/or ability of: stress reduction   3.  Demonstrate ability to: reduce stress   INTERVENTIONS: Interventions utilized:  psychoeducation and supportive counseling  Standardized Assessments completed: none   ASSESSMENT: Patient currently experiencing anxiety affecting pregnancy   Patient may benefit from integrated behavioral health   PLAN: 1. Follow up with behavioral health clinician on : three weeks  2. Behavioral recommendations: continue self managing symptoms  3. Referral(s): none 4. "From scale of 1-10, how likely are you to follow plan?":   Gwyndolyn Saxon, LCSW

## 2019-05-06 LAB — CULTURE, BETA STREP (GROUP B ONLY): Strep Gp B Culture: NEGATIVE

## 2019-05-09 ENCOUNTER — Encounter (HOSPITAL_COMMUNITY): Payer: Self-pay | Admitting: Obstetrics and Gynecology

## 2019-05-09 ENCOUNTER — Other Ambulatory Visit: Payer: Self-pay

## 2019-05-09 ENCOUNTER — Inpatient Hospital Stay (HOSPITAL_COMMUNITY)
Admission: AD | Admit: 2019-05-09 | Discharge: 2019-05-12 | DRG: 806 | Disposition: A | Discharge status: Home / Self Care | Payer: BLUE CROSS/BLUE SHIELD | Attending: Obstetrics and Gynecology | Admitting: Obstetrics and Gynecology

## 2019-05-09 ENCOUNTER — Ambulatory Visit (INDEPENDENT_AMBULATORY_CARE_PROVIDER_SITE_OTHER): Payer: BLUE CROSS/BLUE SHIELD | Admitting: Obstetrics and Gynecology

## 2019-05-09 VITALS — BP 137/90 | HR 87 | Wt 236.0 lb

## 2019-05-09 DIAGNOSIS — F332 Major depressive disorder, recurrent severe without psychotic features: Secondary | ICD-10-CM | POA: Diagnosis present

## 2019-05-09 DIAGNOSIS — O9952 Diseases of the respiratory system complicating childbirth: Secondary | ICD-10-CM | POA: Diagnosis not present

## 2019-05-09 DIAGNOSIS — O9902 Anemia complicating childbirth: Secondary | ICD-10-CM | POA: Diagnosis present

## 2019-05-09 DIAGNOSIS — O133 Gestational [pregnancy-induced] hypertension without significant proteinuria, third trimester: Secondary | ICD-10-CM | POA: Diagnosis not present

## 2019-05-09 DIAGNOSIS — O139 Gestational [pregnancy-induced] hypertension without significant proteinuria, unspecified trimester: Secondary | ICD-10-CM | POA: Diagnosis present

## 2019-05-09 DIAGNOSIS — R600 Localized edema: Secondary | ICD-10-CM

## 2019-05-09 DIAGNOSIS — O134 Gestational [pregnancy-induced] hypertension without significant proteinuria, complicating childbirth: Principal | ICD-10-CM | POA: Diagnosis present

## 2019-05-09 DIAGNOSIS — Z20822 Contact with and (suspected) exposure to covid-19: Secondary | ICD-10-CM | POA: Diagnosis present

## 2019-05-09 DIAGNOSIS — Z3A37 37 weeks gestation of pregnancy: Secondary | ICD-10-CM | POA: Diagnosis not present

## 2019-05-09 DIAGNOSIS — J45909 Unspecified asthma, uncomplicated: Secondary | ICD-10-CM | POA: Diagnosis present

## 2019-05-09 DIAGNOSIS — O99013 Anemia complicating pregnancy, third trimester: Secondary | ICD-10-CM

## 2019-05-09 DIAGNOSIS — Z3493 Encounter for supervision of normal pregnancy, unspecified, third trimester: Secondary | ICD-10-CM

## 2019-05-09 DIAGNOSIS — D573 Sickle-cell trait: Secondary | ICD-10-CM | POA: Diagnosis present

## 2019-05-09 DIAGNOSIS — O99344 Other mental disorders complicating childbirth: Secondary | ICD-10-CM | POA: Diagnosis present

## 2019-05-09 DIAGNOSIS — O0932 Supervision of pregnancy with insufficient antenatal care, second trimester: Secondary | ICD-10-CM

## 2019-05-09 DIAGNOSIS — Z3689 Encounter for other specified antenatal screening: Secondary | ICD-10-CM

## 2019-05-09 LAB — TYPE AND SCREEN
ABO/RH(D): A POS
Antibody Screen: NEGATIVE

## 2019-05-09 LAB — COMPREHENSIVE METABOLIC PANEL WITH GFR
ALT: 13 U/L (ref 0–44)
AST: 16 U/L (ref 15–41)
Albumin: 2.5 g/dL — ABNORMAL LOW (ref 3.5–5.0)
Alkaline Phosphatase: 95 U/L (ref 38–126)
Anion gap: 7 (ref 5–15)
BUN: <5 mg/dL — ABNORMAL LOW (ref 6–20)
CO2: 22 mmol/L (ref 22–32)
Calcium: 8.7 mg/dL — ABNORMAL LOW (ref 8.9–10.3)
Chloride: 108 mmol/L (ref 98–111)
Creatinine, Ser: 0.8 mg/dL (ref 0.44–1.00)
GFR calc Af Amer: >60 mL/min (ref >60)
GFR calc non Af Amer: >60 mL/min (ref >60)
Glucose, Bld: 83 mg/dL (ref 70–99)
Potassium: 4.1 mmol/L (ref 3.5–5.1)
Sodium: 137 mmol/L (ref 135–145)
Total Bilirubin: 0.6 mg/dL (ref 0.3–1.2)
Total Protein: 5.8 g/dL — ABNORMAL LOW (ref 6.5–8.1)

## 2019-05-09 LAB — URINALYSIS, ROUTINE W REFLEX MICROSCOPIC
Bilirubin Urine: NEGATIVE
Glucose, UA: NEGATIVE mg/dL
Hgb urine dipstick: NEGATIVE
Ketones, ur: NEGATIVE mg/dL
Nitrite: NEGATIVE
Protein, ur: NEGATIVE mg/dL
Specific Gravity, Urine: 1.014 (ref 1.005–1.030)
pH: 6 (ref 5.0–8.0)

## 2019-05-09 LAB — PROTEIN / CREATININE RATIO, URINE
Creatinine, Urine: 166.82 mg/dL
Protein Creatinine Ratio: 0.06 mg/mg{creat} (ref 0.00–0.15)
Total Protein, Urine: 10 mg/dL

## 2019-05-09 LAB — CBC
HCT: 29.6 % — ABNORMAL LOW (ref 36.0–46.0)
Hemoglobin: 9.9 g/dL — ABNORMAL LOW (ref 12.0–15.0)
MCH: 25.3 pg — ABNORMAL LOW (ref 26.0–34.0)
MCHC: 33.4 g/dL (ref 30.0–36.0)
MCV: 75.5 fL — ABNORMAL LOW (ref 80.0–100.0)
Platelets: 344 K/uL (ref 150–400)
RBC: 3.92 MIL/uL (ref 3.87–5.11)
RDW: 15 % (ref 11.5–15.5)
WBC: 8.2 K/uL (ref 4.0–10.5)
nRBC: 0 % (ref 0.0–0.2)

## 2019-05-09 LAB — ABO/RH: ABO/RH(D): A POS

## 2019-05-09 LAB — SARS CORONAVIRUS 2 (TAT 6-24 HRS): SARS Coronavirus 2: NEGATIVE

## 2019-05-09 MED ORDER — OXYTOCIN BOLUS FROM INFUSION: 500.0000 mL | Freq: Once | INTRAVENOUS | Status: DC

## 2019-05-09 MED ORDER — OXYTOCIN 40 UNITS IN NORMAL SALINE INFUSION - SIMPLE MED
1.0000 m[IU]/min | INTRAVENOUS | Status: DC
  Administered 2019-05-09: 2 m[IU]/min via INTRAVENOUS
  Filled 2019-05-09: qty 1000

## 2019-05-09 MED ORDER — OXYCODONE-ACETAMINOPHEN 5-325 MG PO TABS: 1.0000 | ORAL_TABLET | ORAL | Status: DC | PRN

## 2019-05-09 MED ORDER — SOD CITRATE-CITRIC ACID 500-334 MG/5ML PO SOLN: 30.0000 mL | ORAL | Status: DC | PRN

## 2019-05-09 MED ORDER — FENTANYL CITRATE (PF) 100 MCG/2ML IJ SOLN
100.0000 ug | Freq: Once | INTRAMUSCULAR | Status: AC
  Administered 2019-05-09: 100 ug via INTRAVENOUS

## 2019-05-09 MED ORDER — ACETAMINOPHEN 325 MG PO TABS: 650.0000 mg | ORAL_TABLET | ORAL | Status: DC | PRN

## 2019-05-09 MED ORDER — FLEET ENEMA 7-19 GM/118ML RE ENEM: 1.0000 | ENEMA | RECTAL | Status: DC | PRN

## 2019-05-09 MED ORDER — MISOPROSTOL 25 MCG QUARTER TABLET
25.0000 ug | ORAL_TABLET | ORAL | Status: DC
  Administered 2019-05-09: 25 ug via VAGINAL

## 2019-05-09 MED ORDER — OXYTOCIN 40 UNITS IN NORMAL SALINE INFUSION - SIMPLE MED: 2.5000 [IU]/h | INTRAVENOUS | Status: DC

## 2019-05-09 MED ORDER — LACTATED RINGERS IV SOLN: INTRAVENOUS | Status: DC

## 2019-05-09 MED ORDER — LACTATED RINGERS IV SOLN: 500.0000 mL | INTRAVENOUS | Status: DC | PRN

## 2019-05-09 MED ORDER — ALBUTEROL SULFATE (2.5 MG/3ML) 0.083% IN NEBU: 3.0000 mL | INHALATION_SOLUTION | Freq: Four times a day (QID) | RESPIRATORY_TRACT | Status: DC | PRN

## 2019-05-09 MED ORDER — FENTANYL CITRATE (PF) 100 MCG/2ML IJ SOLN
100.0000 ug | INTRAMUSCULAR | Status: DC | PRN
  Administered 2019-05-09: 100 ug via INTRAVENOUS
  Filled 2019-05-09: qty 2

## 2019-05-09 MED ORDER — FENTANYL CITRATE (PF) 100 MCG/2ML IJ SOLN
INTRAMUSCULAR | Status: AC
  Filled 2019-05-09: qty 2

## 2019-05-09 MED ORDER — MISOPROSTOL 50MCG HALF TABLET
50.0000 ug | ORAL_TABLET | ORAL | Status: DC
  Administered 2019-05-09 – 2019-05-10 (×2): 50 ug via ORAL
  Filled 2019-05-09 (×2): qty 1

## 2019-05-09 MED ORDER — MISOPROSTOL 25 MCG QUARTER TABLET
ORAL_TABLET | ORAL | Status: AC
  Filled 2019-05-09: qty 1

## 2019-05-09 MED ORDER — LIDOCAINE HCL (PF) 1 % IJ SOLN: 30.0000 mL | INTRAMUSCULAR | Status: DC | PRN

## 2019-05-09 MED ORDER — OXYCODONE-ACETAMINOPHEN 5-325 MG PO TABS: 2.0000 | ORAL_TABLET | ORAL | Status: DC | PRN

## 2019-05-09 MED ORDER — ONDANSETRON HCL 4 MG/2ML IJ SOLN
4.0000 mg | Freq: Four times a day (QID) | INTRAMUSCULAR | Status: DC | PRN
  Administered 2019-05-09: 4 mg via INTRAVENOUS
  Filled 2019-05-09: qty 2

## 2019-05-09 NOTE — Progress Notes (Signed)
Labor Progress Note Brianna Bender is a 19 y.o. G2P0010 at [redacted]w[redacted]d presented for IOL for gHTN. S:  Patient states her pain score is 3/10.  O:  BP 138/82   Pulse 73   Temp 98.3 F (36.8 C) (Oral)   Resp 18   Ht 5\' 2"  (1.575 m)   Wt 107 kg   LMP 08/22/2018 (Exact Date)   SpO2 100%   BMI 43.16 kg/m  EFM: 130/moderate/acels.  CVE: Dilation: 5 Effacement (%): 50 Station: -2 Presentation: Vertex Exam by:: [email or web address] CNM   A&P: 19 y.o. G2P0010 [redacted]w[redacted]d here for IOL for gHTN #Labor: S/p FB and cytotec x 1. Patient now interested in starting pitocin. #Pain: Analgesia PRN #FWB: Cat I #GBS negative #gHTN: Pressures 122-150/64-97-82 since 4pm. No severe range pressures or severe features.  [redacted]w[redacted]d, DO 6:33 PM

## 2019-05-09 NOTE — H&P (Addendum)
OBSTETRIC ADMISSION HISTORY AND PHYSICAL  Enola Siebers is a 19 y.o. female G2P0010 with IUP at [redacted]w[redacted]d by LMP presenting for gHTN. She reports +FMs, No LOF, no VB, no blurry vision, headaches or peripheral edema, and RUQ pain.  She plans on breast feeding. She requests POPs for birth control. She received her prenatal care at [facility]  Dating: By LMP --->  Estimated Date of Delivery: 05/29/19  Sono:    @[redacted]w[redacted]d , CWD, normal anatomy, cephalic presentation, 2261g, EFW   Prenatal History/Complications: Asthma MDD Hx suicidal ideation  Sickle cell trait Frequent headaches  Past Medical History: Past Medical History:  Diagnosis Date  . Asthma   . Sickle cell trait (HCC) 02/25/2019   QTrimester urine culture - 2nd trimester negative.    [ ]  3rd tri Given genetic counselor info to discuss FOB testing    Past Surgical History: Past Surgical History:  Procedure Laterality Date  . WISDOM TOOTH EXTRACTION      Obstetrical History: OB History    Gravida  2   Para  0   Term  0   Preterm  0   AB  1   Living  0     SAB  0   TAB  1   Ectopic  0   Multiple  0   Live Births  0           Social History Social History   Socioeconomic History  . Marital status: Married    Spouse name: Not on file  . Number of children: Not on file  . Years of education: Not on file  . Highest education level: Not on file  Occupational History  . Occupation: 04/25/2019:  Tobacco Use  . Smoking status: Never Smoker  . Smokeless tobacco: Never Used  Substance and Sexual Activity  . Alcohol use: Not Currently  . Drug use: Not Currently    Types: Marijuana  . Sexual activity: Yes    Partners: Male  Other Topics Concern  . Not on file  Social History Narrative  . Not on file   Social Determinants of Health   Financial Resource Strain:   . Difficulty of Paying Living Expenses:   Food Insecurity: No Food Insecurity  . Worried About Airline pilot in  the Last Year: Never true  . Ran Out of Food in the Last Year: Never true  Transportation Needs: No Transportation Needs  . Lack of Transportation (Medical): No  . Lack of Transportation (Non-Medical): No  Physical Activity:   . Days of Exercise per Week:   . Minutes of Exercise per Session:   Stress:   . Feeling of Stress :   Social Connections:   . Frequency of Communication with Friends and Family:   . Frequency of Social Gatherings with Friends and Family:   . Attends Religious Services:   . Active Member of Clubs or Organizations:   . Attends JTT[employer] Meetings:   Programme researcher, broadcasting/film/video Marital Status:     Family History: Family History  Problem Relation Age of Onset  . Healthy Mother   . Healthy Father     Allergies: No Known Allergies  Medications Prior to Admission  Medication Sig Dispense Refill Last Dose  . albuterol (VENTOLIN HFA) 108 (90 Base) MCG/ACT inhaler Inhale 1-2 puffs into the lungs every 6 (six) hours as needed for wheezing or shortness of breath. 8 g 1 04/01/2019 at Unknown time  . cyclobenzaprine (FLEXERIL)  10 MG tablet Take 1 tablet (10 mg total) by mouth every 8 (eight) hours as needed (for headaches). 30 tablet 1 05/07/2019 at Unknown time  . Prenatal MV-Min-FA-Omega-3 (PRENATAL GUMMIES/DHA & FA) 0.4-32.5 MG CHEW Chew 1 each by mouth daily. 60 tablet 3 05/09/2019 at 0800     Review of Systems   All systems reviewed and negative except as stated in HPI  Blood pressure (!) 135/94, pulse 69, temperature 98.8 F (37.1 C), temperature source Oral, resp. rate 18, height 5\' 2"  (1.575 m), weight 107 kg, last menstrual period 08/22/2018, SpO2 100 %. General appearance: alert, cooperative and no distress Lungs: clear to auscultation bilaterally Heart: regular rate and rhythm Abdomen: soft, non-tender; bowel sounds normal Pelvic: deferred Extremities: no sign of DVT Presentation: cephalic Fetal monitoringBaseline: 130 bpm, Variability: Good {> 6 bpm) and  Accelerations: Reactive Uterine activity No ctx Dilation: 1 Effacement (%): 50 Station: -2 Exam by:: Maryelizabeth Kaufmann CNM   Prenatal labs: ABO, Rh: --/--/A POS, A POS Performed at [facility], [place] [address] [place], [place] [address]  [phone]) Antibody: NEG (03/25 1340) Rubella: 1.47 (12/29 1216) RPR: Non Reactive (12/29 1216)  HBsAg: Negative (12/29 1216)  HIV: Non Reactive (12/29 1216)  GBS: Negative/-- (03/18 1542)  2 hr Glucola wnl Genetic screening  NIPS low risk female, sickle cell trait Anatomy US: Limited by habitus but appears normal  Prenatal Transfer Tool  Maternal Diabetes: No Genetic Screening: NIPS normal, sickle cell trait Maternal Ultrasounds/Referrals: Normal Fetal Ultrasounds or other Referrals:  None Maternal Substance Abuse:  No Significant Maternal Medications:  None Significant Maternal Lab Results: Group B Strep negative  Results for orders placed or performed during the hospital encounter of 05/09/19 (from the past 24 hour(s))  Urinalysis, Routine w reflex microscopic   Collection Time: 05/09/19 10:19 AM  Result Value Ref Range   Color, Urine YELLOW YELLOW   APPearance HAZY (A) CLEAR   Specific Gravity, Urine 1.014 1.005 - 1.030   pH 6.0 5.0 - 8.0   Glucose, UA NEGATIVE NEGATIVE mg/dL   Hgb urine dipstick NEGATIVE NEGATIVE   Bilirubin Urine NEGATIVE NEGATIVE   Ketones, ur NEGATIVE NEGATIVE mg/dL   Protein, ur NEGATIVE NEGATIVE mg/dL   Nitrite NEGATIVE NEGATIVE   Leukocytes,Ua MODERATE (A) NEGATIVE   RBC / HPF 0-5 0 - 5 RBC/hpf   WBC, UA 6-10 0 - 5 WBC/hpf   Bacteria, UA RARE (A) NONE SEEN   Squamous Epithelial / LPF 11-20 0 - 5  Protein / creatinine ratio, urine   Collection Time: 05/09/19 10:35 AM  Result Value Ref Range   Creatinine, Urine 166.82 mg/dL   Total Protein, Urine 10 mg/dL   Protein Creatinine Ratio 0.06 0.00 - 0.15 mg/mg[Cre]  CBC   Collection Time: 05/09/19 10:50 AM  Result Value Ref Range   WBC 8.2 4.0 - 10.5  K/uL   RBC 3.92 3.87 - 5.11 MIL/uL   Hemoglobin 9.9 (L) 12.0 - 15.0 g/dL   HCT 29.6 (L) 36.0 - 46.0 %   MCV 75.5 (L) 80.0 - 100.0 fL   MCH 25.3 (L) 26.0 - 34.0 pg   MCHC 33.4 30.0 - 36.0 g/dL   RDW 15.0 11.5 - 15.5 %   Platelets 344 150 - 400 K/uL   nRBC 0.0 0.0 - 0.2 %  Comprehensive metabolic panel   Collection Time: 05/09/19 10:50 AM  Result Value Ref Range   Sodium 137 135 - 145 mmol/L   Potassium 4.1 3.5 - 5.1  mmol/L   Chloride 108 98 - 111 mmol/L   CO2 22 22 - 32 mmol/L   Glucose, Bld 83 70 - 99 mg/dL   BUN <5 (L) 6 - 20 mg/dL   Creatinine, Ser 7.35 0.44 - 1.00 mg/dL   Calcium 8.7 (L) 8.9 - 10.3 mg/dL   Total Protein 5.8 (L) 6.5 - 8.1 g/dL   Albumin 2.5 (L) 3.5 - 5.0 g/dL   AST 16 15 - 41 U/L   ALT 13 0 - 44 U/L   Alkaline Phosphatase 95 38 - 126 U/L   Total Bilirubin 0.6 0.3 - 1.2 mg/dL   GFR calc non Af Amer >60 >60 mL/min   GFR calc Af Amer >60 >60 mL/min   Anion gap 7 5 - 15  Type and screen [facility]   Collection Time: 05/09/19  1:40 PM  Result Value Ref Range   ABO/RH(D) A POS    Antibody Screen NEG    Sample Expiration      05/12/2019,2359 Performed at [facility], [address], [place], [place] [address]   ABO/Rh   Collection Time: 05/09/19  1:40 PM  Result Value Ref Range   ABO/RH(D)      A POS Performed at [facility], [address] [place], [place] [address]     Patient Active Problem List   Diagnosis Date Noted  . Gestational hypertension 05/09/2019  . Asthma affecting pregnancy in third trimester 04/17/2019  . Frequent headaches 04/03/2019  . Sickle cell trait (HCC) 02/25/2019  . Late prenatal care affecting pregnancy in second trimester 02/24/2019  . Supervision of low-risk pregnancy 02/12/2019  . 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 Suicidal ideation 11/18/2016  . Asthma exacerbation 05/06/2013    Assessment/Plan:  Sharis Keeran is a 19 y.o.  G2P0010 at [redacted]w[redacted]d here for IOL for gHTN.  #Labor: Cytotec given, FB now in place, will recheck in 4 hours. #Pain: Analgesia PRN, thinks she will want IV #FWB: Cat I #ID:  GBS neg #MOF: breast #MOC: POPs #Circ:  N/A  Jackelyn Poling, DO  05/09/2019, 3:02 PM  Attestation of Supervision of Student:  I confirm that I have verified the information documented in the resident's note and that I have also personally reperformed the history, physical exam and all medical decision making activities.  I have verified that all services and findings are accurately documented in this student's note; and I agree with management and plan as outlined in the documentation. I have also made any necessary editorial changes.  --Cervix 1/50/-2, vertex by suture --Foley balloon and vaginal Cytotec placed without difficulty by me --Preemptive teaching regarding assessment for Pitocin when foley is dislodged --New diagnosis Gestational Hypertension. Admission PEC labs normal, no severe signs or symptoms --Anticipate NSVD  Postpartum planning --Consider inpatient LCSW given history of suicidal ideation --Plan for two week mood check  Calvert Cantor, CNM Attending Obstetrician & Gynecologist, [facility] for [employer], [facility] 05/09/2019 3:11 PM

## 2019-05-09 NOTE — Progress Notes (Signed)
Labor Progress Note Brianna Bender is a 19 y.o. G2P0010 at [redacted]w[redacted]d presented for IOL for gHTN.  S:  Patient is complaining of back pain.  O:  BP (!) 145/79   Pulse 70   Temp 98.1 F (36.7 C) (Oral)   Resp 15   Ht 5\' 2"  (1.575 m)   Wt 107 kg   LMP 08/22/2018 (Exact Date)   SpO2 100%   BMI 43.16 kg/m  EFM: 135/moderate/acels.  CVE: Dilation: 4 Effacement (%): 60 Station: -2 Presentation: Vertex Exam by:: Dr. [email or web address]   A&P: 19 y.o. G2P0010 [redacted]w[redacted]d here for IOL for gHTN #Labor: S/p FB and cytotec x 1. Pit from [phone]. No cervical change had been made so we will stop pit and give another cytotec. Will recheck in 4 hours to assess need for another cytotec vs restarting pit.  #Pain: Analgesia PRN #FWB: Cat I #GBS negative #gHTN: Continues to have elevated pressures. No severe range pressures or severe features. Most recent BP 145/79  [id number], MD 11:01 PM

## 2019-05-09 NOTE — Progress Notes (Signed)
   PRENATAL VISIT NOTE  Subjective:  Brianna Bender is a 19 y.o. G2P0010 at [redacted]w[redacted]d being seen today for ongoing prenatal care.  She is currently monitored for the following issues for this low-risk pregnancy and has Asthma exacerbation; MDD (major depressive disorder), recurrent severe, without psychosis (HCC); Suicidal ideation; Supervision of low-risk pregnancy; Late prenatal care affecting pregnancy in second trimester; Sickle cell trait (HCC); Frequent headaches; and Asthma affecting pregnancy in third trimester on their problem list.  Patient reports no complaints.  Contractions: Not present. Vag. Bleeding: None.  Movement: Present. Denies leaking of fluid.   The following portions of the patient's history were reviewed and updated as appropriate: allergies, current medications, past family history, past medical history, past social history, past surgical history and problem list.   Objective:   Vitals:   05/09/19 0828 05/09/19 0852  BP: (!) 130/94 137/90  Pulse: 81 87  Weight: 236 lb (107 kg)     Fetal Status: Fetal Heart Rate (bpm): 122 Fundal Height: 37 cm Movement: Present  Presentation: Vertex  General:  Alert, oriented and cooperative. Patient is in no acute distress.  Skin: Skin is warm and dry. No rash noted.   Cardiovascular: Normal heart rate noted  Respiratory: Normal respiratory effort, no problems with respiration noted  Abdomen: Soft, gravid, appropriate for gestational age.  Pain/Pressure: Present     Pelvic: Cervical exam performed in the presence of a chaperone Dilation: 1 Effacement (%): 50 Station: Ballotable  Extremities: Normal range of motion.  Edema: Moderate pitting, indentation subsides rapidly  Mental Status: Normal mood and affect. Normal behavior. Normal judgment and thought content.   Assessment and Plan:  Pregnancy: G2P0010 at [redacted]w[redacted]d 1. Encounter for supervision of low-risk pregnancy in third trimester  - GBS negative  - CBC - HIV Antibody (routine  testing w rflx) - RPR - Urine Culture - no scotoma.  - HA off and on throughout her pregnancy, nothing new.  - Elevated BP X 3- patient to go to MAU for further evaluation of pre E. Terri NP notified of patient coming.   Term labor symptoms and general obstetric precautions including but not limited to vaginal bleeding, contractions, leaking of fluid and fetal movement were reviewed in detail with the patient. Please refer to After Visit Summary for other counseling recommendations.   No follow-ups on file.  Future Appointments  Date Time Provider Department Center  05/16/2019  2:35 PM Girtie Wiersma, Harolyn Rutherford, NP [facility]  05/23/2019  4:15 PM Kaytlyn Din, Harolyn Rutherford, NP [facility]    Venia Carbon, NP

## 2019-05-09 NOTE — MAU Note (Signed)
.   Brianna Bender is a 19 y.o. at [redacted]w[redacted]d here in MAU reporting: she went for her appointment today and was evaluated by J. Rasch NP and went sent over for increase in BP. Denies any pain   Onset of complaint: today Pain score:  Vitals:   05/09/19 1019  BP: 139/90  Pulse: 74  Resp: 16  Temp: 98.2 F (36.8 C)  SpO2: 100%     FHT:127 Lab orders placed from triage: [facility]A

## 2019-05-09 NOTE — MAU Provider Note (Signed)
History     CSN: [id number]  [id number] date and time: 05/09/19 [id number]   First Provider Initiated Contact with Patient 05/09/19 1128      Chief Complaint  Patient presents with  . Contractions   [name] is a 19 y.o. G2P0010 at 4271w1d who presents to MAU for preeclampsia evaluation after she was seen in the clinic today with elevated blood pressure and worsening swelling in her feet. Pt reports the swelling is so intense it "hurts to walk."  Pt denies HA, blurry vision/seeing spots, N/V, epigastric pain, swelling in face and hands, sudden weight gain. Pt denies chest pain and SOB.  Pt denies constipation, diarrhea, or urinary problems. Pt denies fever, chills, fatigue, sweating or changes in appetite. Pt denies dizziness, light-headedness, weakness.  Pt denies VB, ctx, LOF and reports good FM.  Current pregnancy problems? Sickle cell trait, asthma Blood Type? A Positive Allergies? NKDA Current medications? PNVs Current PNC & next appt? ELAM, 05/16/2019   OB History    Gravida  2   Para      Term      Preterm      AB  1   Living        SAB      TAB  1   Ectopic      Multiple      Live Births              Past Medical History:  Diagnosis Date  . Asthma   . Sickle cell trait (HCC) 02/25/2019   QTrimester urine culture - 2nd trimester negative.    [ ]  3rd tri Given genetic counselor info to discuss FOB testing    Past Surgical History:  Procedure Laterality Date  . WISDOM TOOTH EXTRACTION      Family History  Problem Relation Age of Onset  . Healthy Mother   . Healthy Father     Social History   Tobacco Use  . Smoking status: Never Smoker  . Smokeless tobacco: Never Used  Substance Use Topics  . Alcohol use: Not Currently  . Drug use: Not Currently    Types: Marijuana    Allergies: No Known Allergies  Medications Prior to Admission  Medication Sig Dispense Refill Last Dose  . albuterol (VENTOLIN HFA) 108 (90 Base) MCG/ACT  inhaler Inhale 1-2 puffs into the lungs every 6 (six) hours as needed for wheezing or shortness of breath. 8 g 1 04/01/2019 at Unknown time  . cyclobenzaprine (FLEXERIL) 10 MG tablet Take 1 tablet (10 mg total) by mouth every 8 (eight) hours as needed (for headaches). 30 tablet 1 05/07/2019 at Unknown time  . Prenatal MV-Min-FA-Omega-3 (PRENATAL GUMMIES/DHA & FA) 0.4-32.5 MG CHEW Chew 1 each by mouth daily. 60 tablet 3 05/09/2019 at 0800    Review of Systems  Constitutional: Negative for chills, diaphoresis, fatigue and fever.  Eyes: Negative for visual disturbance.  Respiratory: Negative for shortness of breath.   Cardiovascular: Negative for chest pain.  Gastrointestinal: Negative for abdominal pain, constipation, diarrhea, nausea and vomiting.  Genitourinary: Negative for dysuria, flank pain, frequency, pelvic pain, urgency, vaginal bleeding and vaginal discharge.  Musculoskeletal:       Bilateral swelling in feet  Neurological: Negative for dizziness, weakness, light-headedness and headaches.   Physical Exam   Blood pressure 134/76, pulse 77, temperature 98.2 F (36.8 C), resp. rate 16, height 5\' 2"  (1.575 m), weight 107.5 kg, last menstrual period 08/22/2018, SpO2 100 %.  Patient Vitals for the past  24 hrs:  BP Temp Pulse Resp SpO2 Height Weight  05/09/19 1245 134/76 -- 77 -- -- -- --  05/09/19 1230 (!) 148/87 -- 87 -- -- -- --  05/09/19 1215 (!) 144/82 -- 75 -- -- -- --  05/09/19 1145 (!) 145/88 -- 67 -- -- -- --  05/09/19 1130 140/84 -- 67 -- -- -- --  05/09/19 1115 (!) 141/91 -- 73 -- -- -- --  05/09/19 1100 (!) 144/88 -- 77 -- -- -- --  05/09/19 1045 (!) 153/100 -- 76 -- 100 % -- --  05/09/19 1031 (!) 152/102 -- 90 -- -- -- --  05/09/19 1030 (!) 150/81 -- 71 -- 95 % -- --  05/09/19 1019 139/90 98.2 F (36.8 C) 74 16 100 % 5\' 2"  (1.575 m) 107.5 kg   Physical Exam  Constitutional: She is oriented to person, place, and time. She appears well-developed and well-nourished. No  distress.  HENT:  Head: Normocephalic and atraumatic.  Respiratory: Effort normal.  GI: Soft. She exhibits no distension and no mass. There is no abdominal tenderness. There is no rebound and no guarding.  Neurological: She is alert and oriented to person, place, and time.  Skin: Skin is warm and dry. She is not diaphoretic.  Psychiatric: She has a normal mood and affect. Her behavior is normal. Judgment and thought content normal.  Moderate pitting edema in lower extremities  Results for orders placed or performed during the hospital encounter of 05/09/19 (from the past 24 hour(s))  Urinalysis, Routine w reflex microscopic     Status: Abnormal   Collection Time: 05/09/19 10:19 AM  Result Value Ref Range   Color, Urine YELLOW YELLOW   APPearance HAZY (A) CLEAR   Specific Gravity, Urine 1.014 1.005 - 1.030   pH 6.0 5.0 - 8.0   Glucose, UA NEGATIVE NEGATIVE mg/dL   Hgb urine dipstick NEGATIVE NEGATIVE   Bilirubin Urine NEGATIVE NEGATIVE   Ketones, ur NEGATIVE NEGATIVE mg/dL   Protein, ur NEGATIVE NEGATIVE mg/dL   Nitrite NEGATIVE NEGATIVE   Leukocytes,Ua MODERATE (A) NEGATIVE   RBC / HPF 0-5 0 - 5 RBC/hpf   WBC, UA 6-10 0 - 5 WBC/hpf   Bacteria, UA RARE (A) NONE SEEN   Squamous Epithelial / LPF 11-20 0 - 5  Protein / creatinine ratio, urine     Status: None   Collection Time: 05/09/19 10:35 AM  Result Value Ref Range   Creatinine, Urine 166.82 mg/dL   Total Protein, Urine 10 mg/dL   Protein Creatinine Ratio 0.06 0.00 - 0.15 mg/mg[Cre]  CBC     Status: Abnormal   Collection Time: 05/09/19 10:50 AM  Result Value Ref Range   WBC 8.2 4.0 - 10.5 K/uL   RBC 3.92 3.87 - 5.11 MIL/uL   Hemoglobin 9.9 (L) 12.0 - 15.0 g/dL   HCT 05/11/19 (L) 16.9 - 45.0 %   MCV 75.5 (L) 80.0 - 100.0 fL   MCH 25.3 (L) 26.0 - 34.0 pg   MCHC 33.4 30.0 - 36.0 g/dL   RDW 38.8 82.8 - 00.3 %   Platelets 344 150 - 400 K/uL   nRBC 0.0 0.0 - 0.2 %  Comprehensive metabolic panel     Status: Abnormal   Collection  Time: 05/09/19 10:50 AM  Result Value Ref Range   Sodium 137 135 - 145 mmol/L   Potassium 4.1 3.5 - 5.1 mmol/L   Chloride 108 98 - 111 mmol/L   CO2 22 22 - 32 mmol/L  Glucose, Bld 83 70 - 99 mg/dL   BUN <5 (L) 6 - 20 mg/dL   Creatinine, Ser 1.61 0.44 - 1.00 mg/dL   Calcium 8.7 (L) 8.9 - 10.3 mg/dL   Total Protein 5.8 (L) 6.5 - 8.1 g/dL   Albumin 2.5 (L) 3.5 - 5.0 g/dL   AST 16 15 - 41 U/L   ALT 13 0 - 44 U/L   Alkaline Phosphatase 95 38 - 126 U/L   Total Bilirubin 0.6 0.3 - 1.2 mg/dL   GFR calc non Af Amer >60 >60 mL/min   GFR calc Af Amer >60 >60 mL/min   Anion gap 7 5 - 15   [place] MFM OB FOLLOW UP  Result Date: 04/11/2019 ----------------------------------------------------------------------  OBSTETRICS REPORT                       (Signed Final 04/11/2019 12:05 pm) ---------------------------------------------------------------------- Patient Info  ID #:       [id number]                          D.O.B.:  April 10, 2000 (18 yrs)  Name:       Brianna Bender                    Visit Date: 04/10/2019 03:27 pm ---------------------------------------------------------------------- Performed By  Performed By:     Lenise Arena        Ref. Address:      [address]                    [facility]                                                              Rd                                                              Jacky Kindle                                                              [phone]  Attending:        Lin Landsman      Location[facility]                    MD                                        [facility]  Referred By:      Levie Heritage                    MD ---------------------------------------------------------------------- Orders   #  Description  Code         Ordered By   1  [place] MFM OB FOLLOW UP                  [id number]     Rosana Hoes  ----------------------------------------------------------------------   #  Order #                     Accession #                 Episode #   1  [id number]                  [id number]                  [id number]  ---------------------------------------------------------------------- Indications   Encounter for other antenatal screening        Z36.2   follow-up   [redacted] weeks gestation of pregnancy                Z3A.33   Sickle cell trait                              Z86.2   Obesity complicating pregnancy, second         O99.212   trimester (BMI 37)   Asthma                                         O99.89 j45.909  ---------------------------------------------------------------------- Vital Signs                                                 Height:        5'3" ---------------------------------------------------------------------- Fetal Evaluation  Num Of Fetuses:          1  Cardiac Activity:        Observed  Presentation:            Cephalic  Placenta:                Anterior  P. Cord Insertion:       Previously Visualized  Amniotic Fluid  AFI FV:      Within normal limits  AFI Sum(cm)     %Tile       Largest Pocket(cm)  10.47           21          5.1  RUQ(cm)       RLQ(cm)       LUQ(cm)        LLQ(cm)  3.1           0             5.1            2.27 ---------------------------------------------------------------------- Biometry  BPD:      83.4  mm     G. Age:  33w 4d         61  %    CI:          76.9  %    70 - 86  FL/HC:       21.6  %    19.9 - 21.5  HC:      301.2  mm     G. Age:  33w 3d         23  %    HC/AC:       1.01       0.96 - 1.11  AC:       299   mm     G. Age:  33w 6d         76  %    FL/BPD:      78.2  %    71 - 87  FL:       65.2  mm     G. Age:  33w 4d         55  %    FL/AC:       21.8  %    20 - 24  Est. FW:    2261   gm          5 lb     64  % ---------------------------------------------------------------------- OB History  Gravidity:    2  TOP:          1        Living:  0  ---------------------------------------------------------------------- Gestational Age  LMP:           33w 0d        Date:  08/22/18                 EDD:   05/29/19  U/S Today:     33w 4d                                        EDD:   05/25/19  Best:          33w 0d     Det. By:  LMP  (08/22/18)          EDD:   05/29/19 ---------------------------------------------------------------------- Anatomy  Cranium:               Appears normal         Aortic Arch:            Appears normal  Cavum:                 Appears normal         Ductal Arch:            Appears normal  Ventricles:            Appears normal         Diaphragm:              Previously seen  Choroid Plexus:        Previously seen        Stomach:                Appears normal, left                                                                        sided  Cerebellum:  Previously seen        Abdomen:                Appears normal  Posterior Fossa:       Previously seen        Abdominal Wall:         Previously seen  Nuchal Fold:           Not applicable (>20    Cord Vessels:           Previously seen                         wks GA)  Face:                  Appears normal         Kidneys:                Appear normal                         (orbits and profile)  Lips:                  Appears normal         Bladder:                Appears normal  Thoracic:              Appears normal         Spine:                  Ltd views no                                                                        intracranial signs of                                                                        NTD  Heart:                 Appears normal         Upper Extremities:      Appears normal                         (4CH, axis, and                         situs)  RVOT:                  Previously seen        Lower Extremities:      Previously seen  LVOT:                  Previously seen  Other:  Technically difficult due to maternal habitus and fetal position.  ---------------------------------------------------------------------- Cervix Uterus Adnexa  Cervix  Not visualized (advanced GA >  24wks) ---------------------------------------------------------------------- Impression  Normal interval growth  Good fetal movement and amniotic fluid ---------------------------------------------------------------------- Recommendations  Follow up as clinically indicated. ----------------------------------------------------------------------               Lin Landsman, MD Electronically Signed Final Report   04/11/2019 12:05 pm ----------------------------------------------------------------------   MAU Course  Procedures  MDM -preeclampsia evaluation with no severe range BP in MAU on admission -elevated BP of 150s/100s -symptoms include: moderate pitting edema of lower extremities -UA: hazy/mod hgb/rare bacteria, sending urine for culture -CBC: H/H 9.9/29.6, platelets 344 -CMP: serum creatinine 0.80, AST/ALT 16/13 -PCr: 0.06 -EFM: reactive       -baseline: 120       -variability: moderate       -accels: present, 15x15       -decels: absent       -TOCO: irregular ctx, pt reports is not feeling -consulted with Dr. Jolayne Panther, agrees with plan for admission for gHTN -report called to first call L&D -admit to L&D for delivery  Orders Placed This Encounter  Procedures  . Culture, OB Urine    Standing Status:   Standing    Number of Occurrences:   1  . Urinalysis, Routine w reflex microscopic    Standing Status:   Standing    Number of Occurrences:   1  . CBC    Standing Status:   Standing    Number of Occurrences:   1  . Comprehensive metabolic panel    Standing Status:   Standing    Number of Occurrences:   1  . Protein / creatinine ratio, urine    Standing Status:   Standing    Number of Occurrences:   1   No orders of the defined types were placed in this encounter.  Assessment and Plan   1. Gestational hypertension, third trimester   2.  [redacted] weeks gestation of pregnancy   3. NST (non-stress test) reactive   4. Lower extremity edema   5. Anemia during pregnancy in third trimester    -admit to L&D for induction for gHTN  Odie Sera Kyleeann Cremeans 05/09/2019, 12:53 PM

## 2019-05-10 ENCOUNTER — Inpatient Hospital Stay (HOSPITAL_COMMUNITY): Payer: BLUE CROSS/BLUE SHIELD | Admitting: Anesthesiology

## 2019-05-10 DIAGNOSIS — O133 Gestational [pregnancy-induced] hypertension without significant proteinuria, third trimester: Secondary | ICD-10-CM

## 2019-05-10 DIAGNOSIS — Z3A37 37 weeks gestation of pregnancy: Secondary | ICD-10-CM

## 2019-05-10 LAB — URINE CULTURE

## 2019-05-10 LAB — CULTURE, OB URINE: Culture: <10000 — AB

## 2019-05-10 LAB — CBC
HCT: 31.7 % — ABNORMAL LOW (ref 36.0–46.0)
Hematocrit: 30.6 % — ABNORMAL LOW (ref 34.0–46.6)
Hemoglobin: 9.9 g/dL — ABNORMAL LOW (ref 11.1–15.9)
Hemoglobin: 10.4 g/dL — ABNORMAL LOW (ref 12.0–15.0)
MCH: 25.7 pg — ABNORMAL LOW (ref 26.6–33.0)
MCH: 24.9 pg — ABNORMAL LOW (ref 26.0–34.0)
MCHC: 32.4 g/dL (ref 31.5–35.7)
MCHC: 32.8 g/dL (ref 30.0–36.0)
MCV: 80 fL (ref 79–97)
MCV: 75.8 fL — ABNORMAL LOW (ref 80.0–100.0)
Platelets: 325 10*3/uL (ref 150–450)
Platelets: 373 10*3/uL (ref 150–400)
RBC: 3.85 x10E6/uL (ref 3.77–5.28)
RBC: 4.18 MIL/uL (ref 3.87–5.11)
RDW: 15.3 % (ref 11.7–15.4)
RDW: 15.3 % (ref 11.5–15.5)
WBC: 8.6 10*3/uL (ref 3.4–10.8)
WBC: 11.4 10*3/uL — ABNORMAL HIGH (ref 4.0–10.5)
nRBC: 0 % (ref 0.0–0.2)

## 2019-05-10 LAB — RPR
RPR Ser Ql: NONREACTIVE
RPR Ser Ql: NONREACTIVE

## 2019-05-10 LAB — HIV ANTIBODY (ROUTINE TESTING W REFLEX): HIV Screen 4th Generation wRfx: NONREACTIVE

## 2019-05-10 MED ORDER — FENTANYL-BUPIVACAINE-NACL 0.5-0.125-0.9 MG/250ML-% EP SOLN
12.0000 mL/h | EPIDURAL | Status: DC | PRN
  Filled 2019-05-10: qty 250

## 2019-05-10 MED ORDER — PHENYLEPHRINE 40 MCG/ML (10ML) SYRINGE FOR IV PUSH (FOR BLOOD PRESSURE SUPPORT): 80.0000 ug | PREFILLED_SYRINGE | INTRAVENOUS | Status: DC | PRN

## 2019-05-10 MED ORDER — EPHEDRINE 5 MG/ML INJ: 10.0000 mg | INTRAVENOUS | Status: DC | PRN

## 2019-05-10 MED ORDER — LACTATED RINGERS IV SOLN
500.0000 mL | Freq: Once | INTRAVENOUS | Status: AC
  Administered 2019-05-10: 500 mL via INTRAVENOUS

## 2019-05-10 MED ORDER — DIPHENHYDRAMINE HCL 50 MG/ML IJ SOLN: 12.5000 mg | INTRAMUSCULAR | Status: DC | PRN

## 2019-05-10 NOTE — Anesthesia Preprocedure Evaluation (Signed)
Anesthesia Evaluation  Patient identified by MRN, date of birth, ID band Patient awake    Reviewed: Allergy & Precautions, NPO status , Patient's Chart, lab work & pertinent test results  Airway Mallampati: II  TM Distance: >3 FB Neck ROM: Full    Dental no notable dental hx. (+) Teeth Intact, Dental Advisory Given   Pulmonary asthma ,    Pulmonary exam normal breath sounds clear to auscultation       Cardiovascular hypertension, Normal cardiovascular exam Rhythm:Regular Rate:Normal  Gestational   Neuro/Psych Depression negative neurological ROS  negative psych ROS   GI/Hepatic negative GI ROS, Neg liver ROS,   Endo/Other  negative endocrine ROS  Renal/GU negative Renal ROS     Musculoskeletal   Abdominal (+) + obese,   Peds  Hematology Hgb 10.3 plt 373   Anesthesia Other Findings   Reproductive/Obstetrics (+) Pregnancy                            Anesthesia Physical Anesthesia Plan  ASA: III  Anesthesia Plan: Epidural   Post-op Pain Management:    Induction:   PONV Risk Score and Plan:   Airway Management Planned:   Additional Equipment:   Intra-op Plan:   Post-operative Plan:   Informed Consent: I have reviewed the patients History and Physical, chart, labs and discussed the procedure including the risks, benefits and alternatives for the proposed anesthesia with the patient or authorized representative who has indicated his/her understanding and acceptance.       Plan Discussed with:   Anesthesia Plan Comments: (37.2 wk G2P0 w gHTn for LEA)       Anesthesia Quick Evaluation

## 2019-05-10 NOTE — Progress Notes (Addendum)
Labor Progress Note Brianna Bender is a 19 y.o. G2P0010 at [redacted]w[redacted]d presented for IOL for gHTN. S:  Feeling some pressure but no complaints otherwise.  O:  BP 136/65   Pulse 73   Temp 98.2 F (36.8 C) (Oral)   Resp 18   Ht 5\' 2"  (1.575 m)   Wt 107 kg   LMP 08/22/2018 (Exact Date)   SpO2 100%   BMI 43.16 kg/m  EFM: 130/moderate/acels present  CVE: Dilation: 7 Effacement (%): 90 Station: -1 Presentation: Vertex Exam by:: L.Mears,RN   A&P: 19 y.o. G2P0010 [redacted]w[redacted]d for IOL for gHTN #Labor: S/p FB, cytotec x 3, pitocin, AROM. Continuing pitocin. Progressing well. #Pain: Epidural #FWB: Cat I #GBS negative #gHTN: No severe range pressures.   [redacted]w[redacted]d, DO 7:19 PM

## 2019-05-10 NOTE — Progress Notes (Signed)
Labor Progress Note Brianna Bender is a 19 y.o. G2P0010 at [redacted]w[redacted]d presented for IOL for gHTN.  S:  Patient has been able to sleep some and is breathing through her contractions.  O:  BP (!) 150/86   Pulse 65   Temp 98.1 F (36.7 C) (Oral)   Resp 15   Ht 5\' 2"  (1.575 m)   Wt 107 kg   LMP 08/22/2018 (Exact Date)   SpO2 100%   BMI 43.16 kg/m  EFM: 120/moderate/acels.  CVE: Dilation: 4 Effacement (%): 60 Station: -2 Presentation: Vertex Exam by:: [email or web address], RN   A&P: 19 y.o. G2P0010 [redacted]w[redacted]d here for IOL for gHTN #Labor: S/p FB and cytotec x 2. Pit from [phone]. No cervical change, give another cytotec. Plan to restart pitocin around 0715. AROM as indicated.  #Pain: Analgesia PRN #FWB: Cat I #GBS negative #gHTN: Continues to have elevated pressures. No severe range pressures or severe features. Most recent BP 150/86  [id number], MD 3:23 AM

## 2019-05-10 NOTE — Anesthesia Procedure Notes (Signed)
Epidural Patient location during procedure: OB Start time: 05/10/2019 11:08 AM End time: 05/10/2019 11:18 AM  Staffing Anesthesiologist: Trevor Iha, MD Performed: anesthesiologist   Preanesthetic Checklist Completed: patient identified, IV checked, site marked, risks and benefits discussed, surgical consent, monitors and equipment checked, pre-op evaluation and timeout performed  Epidural Patient position: sitting Prep: DuraPrep and site prepped and draped Patient monitoring: continuous pulse ox and blood pressure Approach: midline Location: L3-L4 Injection technique: LOR air  Needle:  Needle type: Tuohy  Needle gauge: 17 G Needle length: 9 cm and 9 Needle insertion depth: 8 cm Catheter type: closed end flexible Catheter size: 19 Gauge Catheter at skin depth: 13 cm Test dose: negative  Assessment Events: blood not aspirated, injection not painful, no injection resistance, no paresthesia and negative IV test  Additional Notes Patient identified. Risks/Benefits/Options discussed with patient including but not limited to bleeding, infection, nerve damage, paralysis, failed block, incomplete pain control, headache, blood pressure changes, nausea, vomiting, reactions to medication both or allergic, itching and postpartum back pain. Confirmed with bedside nurse the patient's most recent platelet count. Confirmed with patient that they are not currently taking any anticoagulation, have any bleeding history or any family history of bleeding disorders. Patient expressed understanding and wished to proceed. All questions were answered. Sterile technique was used throughout the entire procedure. Please see nursing notes for vital signs. Test dose was given through epidural needle and negative prior to continuing to dose epidural or start infusion. Warning signs of high block given to the patient including shortness of breath, tingling/numbness in hands, complete motor block, or any  concerning symptoms with instructions to call for help. Patient was given instructions on fall risk and not to get out of bed. All questions and concerns addressed with instructions to call with any issues. 1 Attempt (S) . Patient tolerated procedure well.

## 2019-05-10 NOTE — Progress Notes (Signed)
Brianna Bender is a 19 y.o. G2P0010 at [redacted]w[redacted]d admitted for [facility]  Subjective:  No complaints. Resting well at this time.   Objective: BP 125/84   Pulse 79   Temp 98.4 F (36.9 C) (Oral)   Resp 15   Ht 5\' 2"  (1.575 m)   Wt 107 kg   LMP 08/22/2018 (Exact Date)   SpO2 100%   BMI 43.16 kg/m  I/O last 3 completed shifts: In: 436.8 [I.V.:436.8] Out: -  No intake/output data recorded.  FHT:  FHR: 125 bpm, variability: moderate,  accelerations:  Present,  decelerations:  Absent UC:   rare SVE:   Dilation: 4 Effacement (%): 60 Station: -2 Exam by:: [email or web address], RN  Labs: Lab Results  Component Value Date   WBC 8.2 05/09/2019   HGB 9.9 (L) 05/09/2019   HCT 29.6 (L) 05/09/2019   MCV 75.5 (L) 05/09/2019   PLT 344 05/09/2019    Assessment / Plan: IOL 2/2 GHTN still in cervical ripening phase.   Labor: early labor/cervical ripening  Preeclampsia:  NA Fetal Wellbeing:  Category I Pain Control:  Labor support without medications I/D:  n/a Anticipated MOD:  NSVD  05/11/2019 DNP, CNM  05/10/19  9:21 AM

## 2019-05-10 NOTE — Progress Notes (Signed)
Labor Progress Note Fatoumata Albaugh is a 19 y.o. G2P0010 at [redacted]w[redacted]d presented for IOL for gHTN. S:  Feeling some contractions and some back pain after the epidural, no complaints otherwise. O:  BP 105/69   Pulse 71   Temp 98.3 F (36.8 C) (Oral)   Resp 16   Ht 5\' 2"  (1.575 m)   Wt 107 kg   LMP 08/22/2018 (Exact Date)   SpO2 100%   BMI 43.16 kg/m  EFM: 120/moderate/acels present  CVE: Dilation: 4 Effacement (%): 60 Station: -2 Presentation: Vertex Exam by:: [email or web address], RN   A&P: 19 y.o. G2P0010 [redacted]w[redacted]d for IOL for gHTN #Labor: S/p FB, cytotec x3. Pitocin at 12, continuing to increase.  #Pain: Epidural #FWB: Cat I #GBS negative #gHTN: No severe range pressures.   [redacted]w[redacted]d, DO 2:07 PM

## 2019-05-10 NOTE — Progress Notes (Signed)
Labor Progress Note Brianna Bender is a 19 y.o. G2P0010 at [redacted]w[redacted]d presented for IOL for gHTN.  S:  Patient states the epidural is working great and she isn't feeling much pain or contractions.   O:  BP 139/81   Pulse 64   Temp 98.4 F (36.9 C) (Oral)   Resp 16   Ht 5\' 2"  (1.575 m)   Wt 107 kg   LMP 08/22/2018 (Exact Date)   SpO2 100%   BMI 43.16 kg/m  EFM: 130/moderate/acels present  CVE: Dilation: 9 Effacement (%): 100 Station: -1 Presentation: Vertex Exam by:: L.Mears,RN   A&P: 19 y.o. G2P0010 [redacted]w[redacted]d for IOL for gHTN #Labor: S/p FB, cytotec x 3, pitocin, AROM. Continuing pitocin. Progressing well. #Pain: Epidural #FWB: Cat I #GBS negative #gHTN: No severe range pressures.   [redacted]w[redacted]d, MD 9:23 PM

## 2019-05-10 NOTE — Progress Notes (Signed)
Labor Progress Note Brianna Bender is a 19 y.o. G2P0010 at [redacted]w[redacted]d presented for IOL for gHTN.  S:  Patient has been able to sleep some and is still able to breathe through her contractions.  O:  BP 137/75   Pulse 86   Temp 98.4 F (36.9 C) (Oral)   Resp 15   Ht 5\' 2"  (1.575 m)   Wt 107 kg   LMP 08/22/2018 (Exact Date)   SpO2 100%   BMI 43.16 kg/m  EFM: 120/moderate/acels Toco: q3-61min  CVE: Dilation: 4 Effacement (%): 60 Station: -2 Presentation: Vertex Exam by:: [email or web address], RN   A&P: 19 y.o. G2P0010 [redacted]w[redacted]d here for IOL for gHTN #Labor: S/p FB and cytotec x3. Restart pit. AROM as indicated.  #Pain: Analgesia PRN #FWB: Cat I #GBS negative #gHTN: Continues to have elevated pressures. No severe range pressures or severe features. Most recent BP 137/75  [redacted]w[redacted]d, MD 7:16 AM

## 2019-05-10 NOTE — Progress Notes (Signed)
Labor Progress Note Brianna Bender is a 19 y.o. G2P0010 at [redacted]w[redacted]d presented for IOL for gHTN. S:  Feeling some back pain and some pressure. O:  BP (!) 152/77   Pulse 91   Temp 98.3 F (36.8 C) (Oral)   Resp 18   Ht 5\' 2"  (1.575 m)   Wt 107 kg   LMP 08/22/2018 (Exact Date)   SpO2 100%   BMI 43.16 kg/m  EFM: 120/moderate/acels present Positive scalp stim.  CVE: Dilation: 5 Effacement (%): 80 Station: -1 Presentation: Vertex Exam by:: Dr. [email or web address]   A&P: 19 y.o. G2P0010 [redacted]w[redacted]d for IOL for gHTN #Labor: S/p FB, cytotec x 3, pitocin at 14. AROM performed with clear fluid. Will consider IUPC if no change on next exam. #Pain: Epidural #FWB: Cat I #GBS negative #gHTN: No severe range pressures. BP range over last 12 hours 98-154/41-102  [redacted]w[redacted]d, DO 3:22 PM

## 2019-05-11 ENCOUNTER — Encounter: Payer: Self-pay | Admitting: Obstetrics and Gynecology

## 2019-05-11 ENCOUNTER — Encounter (HOSPITAL_COMMUNITY): Payer: Self-pay | Admitting: Obstetrics and Gynecology

## 2019-05-11 DIAGNOSIS — Z3A37 37 weeks gestation of pregnancy: Secondary | ICD-10-CM

## 2019-05-11 DIAGNOSIS — O139 Gestational [pregnancy-induced] hypertension without significant proteinuria, unspecified trimester: Secondary | ICD-10-CM

## 2019-05-11 DIAGNOSIS — D649 Anemia, unspecified: Secondary | ICD-10-CM | POA: Insufficient documentation

## 2019-05-11 DIAGNOSIS — O133 Gestational [pregnancy-induced] hypertension without significant proteinuria, third trimester: Secondary | ICD-10-CM

## 2019-05-11 LAB — CBC
HCT: 26.5 % — ABNORMAL LOW (ref 36.0–46.0)
Hemoglobin: 8.8 g/dL — ABNORMAL LOW (ref 12.0–15.0)
MCH: 25.2 pg — ABNORMAL LOW (ref 26.0–34.0)
MCHC: 33.2 g/dL (ref 30.0–36.0)
MCV: 75.9 fL — ABNORMAL LOW (ref 80.0–100.0)
Platelets: 307 10*3/uL (ref 150–400)
RBC: 3.49 MIL/uL — ABNORMAL LOW (ref 3.87–5.11)
RDW: 15.3 % (ref 11.5–15.5)
WBC: 19.3 10*3/uL — ABNORMAL HIGH (ref 4.0–10.5)
nRBC: 0 % (ref 0.0–0.2)

## 2019-05-11 MED ORDER — AMLODIPINE BESYLATE 5 MG PO TABS
5.0000 mg | ORAL_TABLET | Freq: Every day | ORAL | Status: DC
  Administered 2019-05-11 – 2019-05-12 (×2): 5 mg via ORAL
  Filled 2019-05-11 (×2): qty 1

## 2019-05-11 MED ORDER — DIPHENHYDRAMINE HCL 25 MG PO CAPS: 25.0000 mg | ORAL_CAPSULE | Freq: Four times a day (QID) | ORAL | Status: DC | PRN

## 2019-05-11 MED ORDER — SIMETHICONE 80 MG PO CHEW: 80.0000 mg | CHEWABLE_TABLET | ORAL | Status: DC | PRN

## 2019-05-11 MED ORDER — WITCH HAZEL-GLYCERIN EX PADS: 1.0000 "application " | MEDICATED_PAD | CUTANEOUS | Status: DC | PRN

## 2019-05-11 MED ORDER — BENZOCAINE-MENTHOL 20-0.5 % EX AERO
1.0000 "application " | INHALATION_SPRAY | CUTANEOUS | Status: DC | PRN
  Administered 2019-05-11: 1 via TOPICAL
  Filled 2019-05-11: qty 56

## 2019-05-11 MED ORDER — COCONUT OIL OIL
1.0000 "application " | TOPICAL_OIL | Status: DC | PRN
  Administered 2019-05-11: 1 via TOPICAL

## 2019-05-11 MED ORDER — METHYLERGONOVINE MALEATE 0.2 MG/ML IJ SOLN: 0.2000 mg | INTRAMUSCULAR | Status: DC | PRN

## 2019-05-11 MED ORDER — IBUPROFEN 600 MG PO TABS
600.0000 mg | ORAL_TABLET | Freq: Four times a day (QID) | ORAL | Status: DC
  Administered 2019-05-11 – 2019-05-12 (×5): 600 mg via ORAL
  Filled 2019-05-11 (×5): qty 1

## 2019-05-11 MED ORDER — ONDANSETRON HCL 4 MG/2ML IJ SOLN: 4.0000 mg | INTRAMUSCULAR | Status: DC | PRN

## 2019-05-11 MED ORDER — ACETAMINOPHEN 325 MG PO TABS: 650.0000 mg | ORAL_TABLET | ORAL | Status: DC | PRN

## 2019-05-11 MED ORDER — OXYCODONE HCL 5 MG PO TABS: 10.0000 mg | ORAL_TABLET | ORAL | Status: DC | PRN

## 2019-05-11 MED ORDER — OXYCODONE HCL 5 MG PO TABS: 5.0000 mg | ORAL_TABLET | ORAL | Status: DC | PRN

## 2019-05-11 MED ORDER — METHYLERGONOVINE MALEATE 0.2 MG PO TABS: 0.2000 mg | ORAL_TABLET | ORAL | Status: DC | PRN

## 2019-05-11 MED ORDER — TETANUS-DIPHTH-ACELL PERTUSSIS 5-2.5-18.5 LF-MCG/0.5 IM SUSP: 0.5000 mL | Freq: Once | INTRAMUSCULAR | Status: DC

## 2019-05-11 MED ORDER — OXYTOCIN 40 UNITS IN NORMAL SALINE INFUSION - SIMPLE MED: 2.5000 [IU]/h | INTRAVENOUS | Status: DC | PRN

## 2019-05-11 MED ORDER — PRENATAL MULTIVITAMIN CH
1.0000 | ORAL_TABLET | Freq: Every day | ORAL | Status: DC
  Administered 2019-05-11: 1 via ORAL
  Filled 2019-05-11: qty 1

## 2019-05-11 MED ORDER — DIBUCAINE (PERIANAL) 1 % EX OINT: 1.0000 "application " | TOPICAL_OINTMENT | CUTANEOUS | Status: DC | PRN

## 2019-05-11 MED ORDER — FERROUS SULFATE 325 (65 FE) MG PO TABS
325.0000 mg | ORAL_TABLET | Freq: Every day | ORAL | Status: DC
  Administered 2019-05-11 – 2019-05-12 (×2): 325 mg via ORAL
  Filled 2019-05-11 (×2): qty 1

## 2019-05-11 MED ORDER — ONDANSETRON HCL 4 MG PO TABS: 4.0000 mg | ORAL_TABLET | ORAL | Status: DC | PRN

## 2019-05-11 NOTE — Progress Notes (Addendum)
POSTPARTUM PROGRESS NOTE  Post Partum Day 1  Subjective:  Brianna Bender is a 19 y.o. G2P1011 s/p SVD at [redacted]w[redacted]d.  She reports she is doing well. No acute events overnight. She denies any problems with ambulating, voiding or po intake. Denies nausea or vomiting.  Pain is moderately controlled.  Lochia is appropriate.  Objective: Blood pressure (!) 142/92, pulse 89, temperature 100.1 F (37.8 C), temperature source Oral, resp. rate 18, height 5\' 2"  (1.575 m), weight 107 kg, last menstrual period 08/22/2018, SpO2 100 %, unknown if currently breastfeeding.  Physical Exam:  General: alert, cooperative and no distress Chest: no respiratory distress Heart:regular rate, distal pulses intact Abdomen: soft, nontender Uterine Fundus: firm, appropriately tender DVT Evaluation: No calf swelling or tenderness Extremities: no edema Skin: warm, dry  Recent Labs    05/10/19 1028 05/11/19 0140  HGB 10.4* 8.8*  HCT 31.7* 26.5*    Assessment/Plan: Brianna Bender is a 19 y.o. G2P1011 s/p SVD at [redacted]w[redacted]d   PPD#1 - Doing well. Continue routine postpartum care.  Contraception: POP's Feeding: Bottle gHTN: Most recent BP 142/92. Has had multiple elevated BP's since delivery. Start amlodipine 5mg  qday.  Anemia: Hgb 10.4>8.8. Start daily ferrous sulfate.  Dispo: Plan for discharge tomorrow.   LOS: 2 days    [redacted]w[redacted]d MD, PGY-1 OBGYN Faculty Teaching Service  05/11/2019, 5:12 AM   I saw and evaluated the patient. I agree with the findings and the plan of care as documented in the resident's note.  Aldean Jewett, MD [facility] Fellow, [facility] for [facility], [facility]

## 2019-05-11 NOTE — Lactation Note (Signed)
This note was copied from a baby's chart. Lactation Consultation Note  Patient Name: Brianna Bender [id number] Date: 05/11/2019 Reason for consult: Initial assessment;1st time breastfeeding;Early term 37-38.6wks P1, 5 hour ETI infant. Per mom, infant been sleepy latched briefly in L&D. Per mom, she brought her DEBP with her to the hospital from home and mom is active on the [facility] program in [place].  LC entered room mom holding infant swaddle in blankets. LC discussed mom doing STS with infant. Per mom, she has made 3 attempts to latch infant at breast. Infant was sleepy, suckled on glove finger few seconds, Mom attempted to latch infant on right breast using the football hold position, infant was to reluctant to suckle only held breast in mouth and sleepy. Mom will continue to attempt to latch infant at breast and do STS with infant, LC mention this is not uncommon behavior for infant within first few hours of life and less than 24 hours. LC discussed hand expression and mom taught back colostrum is present in breast. Mom knows to breastfeed infant according to feeding cues, 8 to 12 times within 24 hours  and not exceed 3 hours without breastfeeding infant.  Mom knows to ask RN or LC for assistance if needed with latching infant at breast. Reviewed Baby & Me book's Breastfeeding Basics.  LC discussed breastfeeding resources after hospital discharge: [facility] hotline, [facility] outpatient clinic and [facility] online breastfeeding support group.   Maternal Data Formula Feeding for Exclusion: No Has patient been taught Hand Expression?: Yes Does the patient have breastfeeding experience prior to this delivery?: No  Feeding Feeding Type: Breast Fed  LATCH Score Latch: Too sleepy or reluctant, no latch achieved, no sucking elicited.  Audible Swallowing: None  Type of Nipple: Everted at rest and after stimulation  Comfort (Breast/Nipple): Soft / non-tender  Hold (Positioning): Assistance needed to  correctly position infant at breast and maintain latch.  LATCH Score: 5  Interventions Interventions: Breast feeding basics reviewed;Breast compression;Adjust position;Assisted with latch;Support pillows;Skin to skin;Breast massage;Position options;Expressed milk;Hand express  Lactation Tools Discussed/Used WIC Program: Yes   Consult Status Consult Status: Follow-up Date: 05/11/19 Follow-up type: In-patient    Danelle Earthly 05/11/2019, 5:59 AM

## 2019-05-11 NOTE — Progress Notes (Signed)
CSW acknowledged consult and attempted to meet with MOB. However, bedside nurse was attending to MOB.  CSW will meet with MOB at a later time.  Vilas Edgerly D. Leilanie Rauda, MSW, LCSWA Clinical Social Worker [phone] 

## 2019-05-11 NOTE — Discharge Summary (Signed)
Postpartum Discharge Summary     Patient Name: Brianna Bender DOB: 10/12/2000 MRN: [id number]  Date of admission: 05/09/2019 Delivering Provider: Paulla Dolly   Date of discharge: 05/12/2019  Admitting diagnosis: Gestational hypertension [O13.9] Intrauterine pregnancy: [redacted]w[redacted]d    Secondary diagnosis:  Active Problems:   MDD (major depressive disorder), recurrent severe, without psychosis ([place]   Late prenatal care affecting pregnancy in second trimester   Sickle cell trait ([place]   Asthma affecting pregnancy in third trimester   Gestational hypertension  Additional problems: None     Discharge diagnosis: Term Pregnancy Delivered and Gestational Hypertension                                                                                                Post partum procedures:None  Augmentation: AROM, Pitocin, Cytotec and Foley Balloon  Complications: None  Hospital course:  Induction of Labor With Vaginal Delivery   19y.o. yo G2P0010 at 311w3das admitted to the hospital 05/09/2019 for induction of labor.  Indication for induction: Gestational hypertension.  Patient had an uncomplicated labor course as follows: Initial SVE 1/50/ballotable. Patient received Cytotec, foley balloon, Pit and AROM. She then progressed to complete with uncomplicated delivery. Membrane Rupture Time/Date: 3:18 PM ,05/10/2019   Intrapartum Procedures: Episiotomy: None [1]                                         Lacerations:  1st degree [2];Labial [10]  Patient had delivery of a Viable infant.  Information for the patient's newborn:  BlGita, Dilger0[[id number]]Delivery Method: Vaginal, Spontaneous(Filed from Delivery Summary)    05/10/2019  Details of delivery can be found in separate delivery note. BP's monitored and patient started on Norvasc 5 mg. Patient had a routine postpartum course. Patient is discharged home 05/12/19. Delivery time: 11:57 PM    Magnesium Sulfate received: No BMZ received:  No Rhophylac:No MMR:No Transfusion:No  Physical exam  Vitals:   05/11/19 1556 05/11/19 1938 05/11/19 2215 05/12/19 0532  BP: 138/74 130/76 123/80 128/75  Pulse: 78 80 89 73  Resp: '18 17 17 18  ' Temp: 98.2 F (36.8 C) 98.1 F (36.7 C) 98.9 F (37.2 C) 98.1 F (36.7 C)  TempSrc: Oral Oral Oral Oral  SpO2:  100% 99% 100%  Weight:      Height:       General: alert, cooperative and no distress Lochia: appropriate Uterine Fundus: firm Incision: N/A DVT Evaluation: No evidence of DVT seen on physical exam. Labs: Lab Results  Component Value Date   WBC 19.3 (H) 05/11/2019   HGB 8.8 (L) 05/11/2019   HCT 26.5 (L) 05/11/2019   MCV 75.9 (L) 05/11/2019   PLT 307 05/11/2019   CMP Latest Ref Rng & Units 05/09/2019  Glucose 70 - 99 mg/dL 83  BUN 6 - 20 mg/dL <5(L)  Creatinine 0.44 - 1.00 mg/dL 0.80  Sodium 135 - 145 mmol/L 137  Potassium 3.5 - 5.1 mmol/L 4.1  Chloride 98 -  111 mmol/L 108  CO2 22 - 32 mmol/L 22  Calcium 8.9 - 10.3 mg/dL 8.7(L)  Total Protein 6.5 - 8.1 g/dL 5.8(L)  Total Bilirubin 0.3 - 1.2 mg/dL 0.6  Alkaline Phos 38 - 126 U/L 95  AST 15 - 41 U/L 16  ALT 0 - 44 U/L 13   Edinburgh Score: Edinburgh Postnatal Depression Scale Screening Tool 05/11/2019  I have been able to laugh and see the funny side of things. 0  I have looked forward with enjoyment to things. 0  I have blamed myself unnecessarily when things went wrong. 1  I have been anxious or worried for no good reason. 1  I have felt scared or panicky for no good reason. 1  Things have been getting on top of me. 1  I have been so unhappy that I have had difficulty sleeping. 1  I have felt sad or miserable. 0  I have been so unhappy that I have been crying. 0  The thought of harming myself has occurred to me. 1  Edinburgh Postnatal Depression Scale Total 6    Discharge instruction: per After Visit Summary and "Baby and Me Booklet".  After visit meds:  Allergies as of 05/12/2019   No Known Allergies      Medication List    TAKE these medications   acetaminophen 325 MG tablet Commonly known as: Tylenol Take 2 tablets (650 mg total) by mouth every 6 (six) hours as needed (for pain scale < 4).   albuterol 108 (90 Base) MCG/ACT inhaler Commonly known as: VENTOLIN HFA Inhale 1-2 puffs into the lungs every 6 (six) hours as needed for wheezing or shortness of breath.   amLODipine 5 MG tablet Commonly known as: NORVASC Take 1 tablet (5 mg total) by mouth daily.   cyclobenzaprine 10 MG tablet Commonly known as: FLEXERIL Take 1 tablet (10 mg total) by mouth every 8 (eight) hours as needed (for headaches).   ferrous sulfate 325 (65 FE) MG tablet Take 1 tablet (325 mg total) by mouth every other day.   ibuprofen 600 MG tablet Commonly known as: ADVIL Take 1 tablet (600 mg total) by mouth every 6 (six) hours.   norethindrone 0.35 MG tablet Commonly known as: MICRONOR Take 1 tablet (0.35 mg total) by mouth daily.   Prenatal Gummies/DHA & FA 0.4-32.5 MG Chew Chew 1 each by mouth daily.       Diet: routine diet  Activity: Advance as tolerated. Pelvic rest for 6 weeks.   Outpatient follow up:4 weeks Follow up Appt:No future appointments. Follow up Visit:    Please schedule this patient for Postpartum visit in: 4 weeks with the following provider: Any provider Virtual For C/S patients schedule nurse incision check in weeks 2 weeks: no High risk pregnancy complicated by: HTN Delivery mode:  SVD Anticipated Birth Control:  POPs PP Procedures needed: BP check  Schedule Integrated [place] visit: yes     Newborn Data: Live born female  Birth Weight: 2960g  APGAR: 40, 9  Newborn Delivery   Birth date/time: 05/10/2019 23:57:00 Delivery type: Vaginal, Spontaneous      Baby Feeding: Bottle and breast Disposition:home with mother   05/12/2019 Chauncey Mann, MD

## 2019-05-11 NOTE — Progress Notes (Deleted)
OB/GYN Faculty Practice Delivery Note  Brianna Bender is a 19 y.o. G2P0010 s/p VD at [redacted]w[redacted]d. She was admitted for IOL for gHTN.   ROM: 8h 76m with clear fluid GBS Status:  Negative/-- (03/18 1542)  Labor Progress: . Patient presented to L&D for IOL for gHTN. Initial SVE: 1/50/ballotable. Patient received cytotec and pitocin. Epidural for pain management. AROM'd. She then progressed to complete.   Delivery Date/Time: 05/10/19 23:57 Delivery: Called to room and patient was complete and pushing. Head position was ROA and delivered with ease over the perineum. No nuchal cord present. Shoulder and body delivered in usual fashion. Infant with spontaneous cry, placed on mother's abdomen, dried and stimulated. Cord clamped x 2 after 1-minute delay, and cut by FOB. Cord blood drawn. Placenta delivered spontaneously with gentle cord traction. Fundus firm with massage and pitocin started. Labia, perineum, vagina, and cervix inspected and significant for 1st degree perineal and right labial laceration.   Baby Weight:  pending  Cord: central insertion, 3 vessel Placenta: Sent to L&D Complications: None Lacerations: 1st degree perineal and right labial, and were repaired in the standard fashion EBL: 446cc Analgesia: Epidural  Infant: APGAR (1 MIN): 9 APGAR (5 MINS): 9   Brianna Bender [name], Medical Student  05/11/2019, 12:48 AM

## 2019-05-12 MED ORDER — AMLODIPINE BESYLATE 5 MG PO TABS: 5.0000 mg | ORAL_TABLET | Freq: Every day | ORAL | 0 refills | Status: DC

## 2019-05-12 MED ORDER — FERROUS SULFATE 325 (65 FE) MG PO TABS: 325.0000 mg | ORAL_TABLET | ORAL | 0 refills | Status: AC

## 2019-05-12 MED ORDER — IBUPROFEN 600 MG PO TABS: 600.0000 mg | ORAL_TABLET | Freq: Four times a day (QID) | ORAL | 0 refills | Status: DC

## 2019-05-12 MED ORDER — ACETAMINOPHEN 325 MG PO TABS: 650.0000 mg | ORAL_TABLET | Freq: Four times a day (QID) | ORAL | 0 refills | Status: AC | PRN

## 2019-05-12 MED ORDER — NORETHINDRONE 0.35 MG PO TABS: 1.0000 | ORAL_TABLET | Freq: Every day | ORAL | 11 refills | Status: DC

## 2019-05-12 NOTE — Clinical Social Work Maternal (Signed)
CLINICAL SOCIAL WORK MATERNAL/CHILD NOTE  Patient Details  Name: Brianna Bender MRN: [id number] Date of Birth: June 10, 2000  Date:  05/12/2019  Clinical Social Worker Initiating Note:  Georgeanne Nim, MSW, LCSWA Date/Time: Initiated:  05/12/19/1000     Child's Name:  Brianna Bender'   Biological Parents:  Mother, Father(FOB, Regina Eck, 05/06/2018)   Need for Interpreter:  None   Reason for Referral:  [place] Concerns(Hx of [facility])   Address:  [address] [place] [address]    Phone number:  [phone] (home)     Additional phone number: none stated  Household Members/Support Persons (HM/SP):   Household Member/Support Person 1, Household Member/Support Person 2   HM/SP Name Relationship DOB or Age  HM/SP -1 Jory Ee mother 11/30/1979  HM/SP -2 Dennison Nancy step-father 03/25/1973  HM/SP -3        HM/SP -4        HM/SP -5        HM/SP -6        HM/SP -7        HM/SP -8          Natural Supports (not living in the home):  Parent, Extended Family, Other (Comment)(God-mother)   Professional Supports:     Employment: Unemployed   Type of Work:     Education:  [place] arranged:    Pensions consultant:  Multimedia programmer   Other Resources:  [employer], Physicist, medical    Cultural/Religious Considerations Which May Impact Care:  none stated  Strengths:  Ability to meet basic needs , Engineer, materials, Home prepared for child , Understanding of illness   Psychotropic Medications:         Pediatrician:    [place] area  Pediatrician List:   [facility]  [place]      Pediatrician Fax Number:    Risk Factors/Current Problems:  Mental Health Concerns    Cognitive State:  Able to Concentrate , Alert , Goal Oriented , Insightful    Mood/Affect:  Interested , Comfortable , Happy , Calm    CSW Assessment: CSW  received consult due to hx of [place], depression, and suicidal ideations.  CSW met with MOB at bedside to discuss [place] and substance hx. MOB was in room alone with infant, Che'. MOB presented as easily engaged and receptive to the visit. MOB displayed a full range in affect and was in a pleasant mood.  MOB presented as attentive during education and presented with insight on signs and symptoms of depression. MOB denied any SI, HI, or current domestic violence.   MOB openly discussed history of depression and suicidal ideations and denied any additional dx. MOB stated that onset of symptoms (sadness, crying, and isolation) occurred while in high school, almost 3 years ago. MOB reported being virtually bullied; girls created a Facebook page about her, teased her, and told her she should kill herself. MOB reported one [place] hospitalization, after telling school counselor she thought about driving off the road. Statement prompted counselor to send for evaluation. MOB reported staying on hospital for 8 days. MOB stated she changed social circle and underwent counseling at [facility] for one year following event. MOB denies any additional suicidal ideations beyond mentioned 1x event. MOB denies any depressive sx since 2018. MOB reported taking medication for depression only first week after hospitalization. MOB explained  she discontinued medication because it gave her a false sense of happiness and didn't like how it made her feel. MOB reported copying strategies such as, journaling, talking with mom, being selective of social circle, and planning specific goals with timelines have been extremely helpful in efforts to manage depressive sx. MOB identified her mom, FOB's sister, and godmother as her support system. MOB denied any current SI, HI, or domestic violence concerns.   MOB denied any substance use beyond trying marijuana once around the age of 41. However, MOB mentioned using CBD oils for  relaxation. CSW informed MOB of hospital's drug screen process, baby's negative UDS, pending UDS, and CPS report if warranted. MOB asked appropriate questions and stated understanding.   CSW provided education regarding the baby blues period vs. perinatal mood disorders, discussed treatment and gave resources for mental health follow up if concerns arise.  CSW recommends self-evaluation during the postpartum time period using the New Mom Checklist from Postpartum Progress and encouraged MOB to contact a medical professional if symptoms are noted at any time.  MOB asked appropriate questions and stated understanding.    CSW provided review of Sudden Infant Death Syndrome (SIDS) precautions. MOB confirmed having all needed items for baby including new car seat and bassinet for infant's safe sleeping area.      CSW identifies no further need for intervention and no barriers to discharge at this time.   CSW Plan/Description:  No Further Intervention Required/No Barriers to Discharge, Sudden Infant Death Syndrome (SIDS) Education, Perinatal Mood and Anxiety Disorder (PMADs) Education, [place], CSW Will Continue to Monitor Umbilical Cord Tissue Drug Screen Results and Make Report if Warranted   Kennis Buell D. Lissa Morales, MSW, [facility] Clinical Social Worker [phone] 05/12/2019, 1:50 PM

## 2019-05-12 NOTE — Lactation Note (Signed)
This note was copied from a baby's chart. Lactation Consultation Note  Patient Name: Brianna Bender [id number] Date: 05/12/2019 Reason for consult: Follow-up assessment  P1 mother whose infant is now 56 hours old.  This is an ETI at 37+2 weeks.  Mother called for latch assistance.  Assisted to position mother and baby appropriately.  Mother's breasts are soft and non tender and nipples are everted and intact.  Mother was unable to hand express colostrum at this time.  Assisted baby to latch in the football hold on the left breast.  However, once at the breast, baby was not interested in sucking.  Constant gentle stimulation needed.  She would suck in short bursts intermittently but not enough to be effective at the breast.  Discussed how mother will need to continue supplementation at home after discharge.  Provided the guidelines for the LPTI and showed mother how to read the supplementation volumes.  Demonstrated paced bottle feeding and effective burping.  Baby consumed 13 mls of Enfamil using an extra slow flow nipple.  Burped well and fell asleep after feeding.  Provided a few nipples for mother to take home.  Mother will continue putting baby to breast prior to supplementation.  Reminded her that it may take a couple of weeks for baby to effectively feed at the breast due to her age.  Reinforced the importance of pumping after every feeding attempt for 15 minutes and to feed back any EBM she obtains.  RN updated.  Mother is waiting for the pediatrician's discharge order.  No support person here at this time.   Maternal Data Formula Feeding for Exclusion: Yes Reason for exclusion: Mother's choice to formula and breast feed on admission Has patient been taught Hand Expression?: Yes Does the patient have breastfeeding experience prior to this delivery?: No  Feeding Feeding Type: Bottle Fed - Formula Nipple Type: Extra Slow Flow  LATCH Score Latch: Repeated attempts needed to sustain  latch, nipple held in mouth throughout feeding, stimulation needed to elicit sucking reflex.  Audible Swallowing: A few with stimulation  Type of Nipple: Everted at rest and after stimulation  Comfort (Breast/Nipple): Filling, red/small blisters or bruises, mild/mod discomfort  Hold (Positioning): Assistance needed to correctly position infant at breast and maintain latch.  LATCH Score: 6  Interventions Interventions: Breast feeding basics reviewed;Assisted with latch;Skin to skin;Breast massage;Hand express;Breast compression;Adjust position;Position options;Support pillows  Lactation Tools Discussed/Used     Consult Status Consult Status: Complete Date: 05/13/19 Follow-up type: Call as needed    Cambrey Lupi R Patty Leitzke 05/12/2019, 1:19 PM

## 2019-05-12 NOTE — Progress Notes (Signed)
CSW received consult due to score 6 with #10 with 1 on Edinburgh Depression Screen.    CSW completed full assessment prior to knowledge of [place] however, CSW spoke with MOB and MOB stated she misread test due to rushing. MOB denied any SI since age 19. MOB denied any current SI, HI, or domestic violence concerns.   CSW identifies no further need for intervention and no barriers to discharge at this time.  Charizma Gardiner D. Dortha Kern, MSW, [facility] Clinical Social Worker [phone]

## 2019-05-12 NOTE — Lactation Note (Signed)
This note was copied from a baby's chart. Lactation Consultation Note  Patient Name: Brianna Bender [id number] Date: 05/12/2019 Reason for consult: Follow-up assessment  P1 mother whose infant is now 11 hours old.  This is an ETI at 37+2 weeks.    MD at bedside completing exam when I arrived.  Spoke with RN prior to my arrival and baby had fed approximately one hour ago.    Offered to observe a latch and mother agreeable.  Mother's breasts are soft and non tender and nipples are everted and intact.  Attempted to latch to the right breast in the football hold but baby too sleepy.  She was not interested in opening her mouth.  Asked mother to call her RN/LC at the next feeding for a latch assist/observation.  Mother will call.  Mother has a DEBP for home use.  Father present.  RN updated.   Maternal Data    Feeding    LATCH Score                   Interventions    Lactation Tools Discussed/Used     Consult Status Consult Status: Follow-up Date: 05/12/19 Follow-up type: Call as needed    Sherrie Marsan R Silas Muff 05/12/2019, 10:24 AM

## 2019-05-12 NOTE — Lactation Note (Signed)
This note was copied from a baby's chart. Lactation Consultation Note  Patient Name: Brianna Bender Today's Date: 05/12/2019  P1, 27 hour ETI female infant. LC entered room mom asleep and dad was still in chair doing STS with infant. LC mention to dad she would come back later when mom is awake.    Maternal Data    Feeding Feeding Type: Breast Fed  LATCH Score Latch: Grasps breast easily, tongue down, lips flanged, rhythmical sucking.  Audible Swallowing: A few with stimulation  Type of Nipple: Everted at rest and after stimulation  Comfort (Breast/Nipple): Soft / non-tender  Hold (Positioning): Assistance needed to correctly position infant at breast and maintain latch.  LATCH Score: 8  Interventions Interventions: Breast feeding basics reviewed;Assisted with latch;Breast massage;Hand express;Breast compression;Adjust position;Support pillows;Expressed milk;Hand pump  Lactation Tools Discussed/Used     Consult Status      Brianna Bender 05/12/2019, 3:54 AM

## 2019-05-12 NOTE — Lactation Note (Signed)
This note was copied from a baby's chart. Lactation Consultation Note  Patient Name: Brianna Bender [id number] Date: 05/12/2019 Reason for consult: Follow-up assessment   LC Follow Up Visit:  Attempted to visit with mother, however, she was asleep.  Will return later today.   Maternal Data    Feeding Feeding Type: Breast Fed  LATCH Score                   Interventions    Lactation Tools Discussed/Used     Consult Status Consult Status: Follow-up Date: 05/12/19 Follow-up type: In-patient    Dora Sims 05/12/2019, 7:34 AM

## 2019-05-12 NOTE — Anesthesia Postprocedure Evaluation (Signed)
Anesthesia Post Note  Patient: Brianna Bender  Procedure(s) Performed: AN AD HOC LABOR EPIDURAL     Patient location during evaluation: Mother Baby Anesthesia Type: Epidural Level of consciousness: awake and alert Pain management: pain level controlled Vital Signs Assessment: post-procedure vital signs reviewed and stable Respiratory status: spontaneous breathing, nonlabored ventilation and respiratory function stable Cardiovascular status: stable Postop Assessment: no headache, no backache and epidural receding Anesthetic complications: no    Last Vitals:  Vitals:   05/11/19 2215 05/12/19 0532  BP: 123/80 128/75  Pulse: 89 73  Resp: 17 18  Temp: 37.2 C 36.7 C  SpO2: 99% 100%    Last Pain:  Vitals:   05/12/19 0532  TempSrc: Oral  PainSc:    Pain Goal:                   [facility]ica Records

## 2019-05-16 ENCOUNTER — Encounter: Payer: BLUE CROSS/BLUE SHIELD | Admitting: Obstetrics and Gynecology

## 2019-05-18 ENCOUNTER — Emergency Department (HOSPITAL_COMMUNITY): Payer: BLUE CROSS/BLUE SHIELD

## 2019-05-18 ENCOUNTER — Emergency Department (HOSPITAL_COMMUNITY)
Admission: EM | Admit: 2019-05-18 | Discharge: 2019-05-19 | Disposition: A | Discharge status: Home / Self Care | Payer: BLUE CROSS/BLUE SHIELD | Attending: Emergency Medicine | Admitting: Emergency Medicine

## 2019-05-18 ENCOUNTER — Other Ambulatory Visit: Payer: Self-pay

## 2019-05-18 ENCOUNTER — Encounter (HOSPITAL_COMMUNITY): Payer: Self-pay

## 2019-05-18 DIAGNOSIS — J45909 Unspecified asthma, uncomplicated: Secondary | ICD-10-CM | POA: Diagnosis not present

## 2019-05-18 DIAGNOSIS — O9953 Diseases of the respiratory system complicating the puerperium: Secondary | ICD-10-CM | POA: Insufficient documentation

## 2019-05-18 DIAGNOSIS — R519 Headache, unspecified: Secondary | ICD-10-CM | POA: Diagnosis not present

## 2019-05-18 DIAGNOSIS — O165 Unspecified maternal hypertension, complicating the puerperium: Secondary | ICD-10-CM

## 2019-05-18 DIAGNOSIS — I10 Essential (primary) hypertension: Secondary | ICD-10-CM | POA: Diagnosis not present

## 2019-05-18 DIAGNOSIS — Z79899 Other long term (current) drug therapy: Secondary | ICD-10-CM | POA: Insufficient documentation

## 2019-05-18 LAB — URINALYSIS, ROUTINE W REFLEX MICROSCOPIC
Bilirubin Urine: NEGATIVE
Glucose, UA: NEGATIVE mg/dL
Ketones, ur: NEGATIVE mg/dL
Nitrite: NEGATIVE
Protein, ur: NEGATIVE mg/dL
Specific Gravity, Urine: 1.005 (ref 1.005–1.030)
pH: 6 (ref 5.0–8.0)

## 2019-05-18 LAB — COMPREHENSIVE METABOLIC PANEL
ALT: 22 U/L (ref 0–44)
AST: 14 U/L — ABNORMAL LOW (ref 15–41)
Albumin: 3.2 g/dL — ABNORMAL LOW (ref 3.5–5.0)
Alkaline Phosphatase: 82 U/L (ref 38–126)
Anion gap: 8 (ref 5–15)
BUN: 7 mg/dL (ref 6–20)
CO2: 23 mmol/L (ref 22–32)
Calcium: 9 mg/dL (ref 8.9–10.3)
Chloride: 109 mmol/L (ref 98–111)
Creatinine, Ser: 1.06 mg/dL — ABNORMAL HIGH (ref 0.44–1.00)
GFR calc Af Amer: >60 mL/min (ref >60)
GFR calc non Af Amer: >60 mL/min (ref >60)
Glucose, Bld: 95 mg/dL (ref 70–99)
Potassium: 3.8 mmol/L (ref 3.5–5.1)
Sodium: 140 mmol/L (ref 135–145)
Total Bilirubin: 0.2 mg/dL — ABNORMAL LOW (ref 0.3–1.2)
Total Protein: 6.8 g/dL (ref 6.5–8.1)

## 2019-05-18 LAB — CBC WITH DIFFERENTIAL/PLATELET
Abs Immature Granulocytes: 0.34 10*3/uL — ABNORMAL HIGH (ref 0.00–0.07)
Basophils Absolute: 0.1 10*3/uL (ref 0.0–0.1)
Basophils Relative: 1 %
Eosinophils Absolute: 0.4 10*3/uL (ref 0.0–0.5)
Eosinophils Relative: 3 %
HCT: 29.5 % — ABNORMAL LOW (ref 36.0–46.0)
Hemoglobin: 9.4 g/dL — ABNORMAL LOW (ref 12.0–15.0)
Immature Granulocytes: 3 %
Lymphocytes Relative: 24 %
Lymphs Abs: 2.7 10*3/uL (ref 0.7–4.0)
MCH: 24.7 pg — ABNORMAL LOW (ref 26.0–34.0)
MCHC: 31.9 g/dL (ref 30.0–36.0)
MCV: 77.6 fL — ABNORMAL LOW (ref 80.0–100.0)
Monocytes Absolute: 0.7 10*3/uL (ref 0.1–1.0)
Monocytes Relative: 6 %
Neutro Abs: 7 10*3/uL (ref 1.7–7.7)
Neutrophils Relative %: 63 %
Platelets: 505 10*3/uL — ABNORMAL HIGH (ref 150–400)
RBC: 3.8 MIL/uL — ABNORMAL LOW (ref 3.87–5.11)
RDW: 15.9 % — ABNORMAL HIGH (ref 11.5–15.5)
WBC: 11.1 10*3/uL — ABNORMAL HIGH (ref 4.0–10.5)
nRBC: 0.5 % — ABNORMAL HIGH (ref 0.0–0.2)

## 2019-05-18 LAB — C-REACTIVE PROTEIN: CRP: 1.9 mg/dL — ABNORMAL HIGH (ref <1.0)

## 2019-05-18 LAB — SEDIMENTATION RATE: Sed Rate: 44 mm/hr — ABNORMAL HIGH (ref 0–22)

## 2019-05-18 MED ORDER — MAGNESIUM SULFATE 2 GM/50ML IV SOLN
2.0000 g | Freq: Once | INTRAVENOUS | Status: AC
  Administered 2019-05-18: 2 g via INTRAVENOUS
  Filled 2019-05-18: qty 50

## 2019-05-18 MED ORDER — ACETAMINOPHEN 325 MG PO TABS
650.0000 mg | ORAL_TABLET | Freq: Once | ORAL | Status: AC
  Administered 2019-05-18: 650 mg via ORAL
  Filled 2019-05-18: qty 2

## 2019-05-18 MED ORDER — METOCLOPRAMIDE HCL 5 MG/ML IJ SOLN
10.0000 mg | Freq: Once | INTRAMUSCULAR | Status: AC
  Administered 2019-05-18: 10 mg via INTRAVENOUS
  Filled 2019-05-18: qty 2

## 2019-05-18 MED ORDER — SODIUM CHLORIDE (PF) 0.9 % IJ SOLN
INTRAMUSCULAR | Status: AC
  Filled 2019-05-18: qty 50

## 2019-05-18 MED ORDER — IOHEXOL 350 MG/ML SOLN
100.0000 mL | Freq: Once | INTRAVENOUS | Status: AC | PRN
  Administered 2019-05-18: 100 mL via INTRAVENOUS

## 2019-05-18 NOTE — ED Notes (Signed)
Pt sitting up in bed talking on the phone. NAD noted. Will continue to monitor.

## 2019-05-18 NOTE — Discharge Instructions (Addendum)
We saw in the ER for headaches. You also have elevated blood pressure in the setting of being less than a week after delivering her baby.  Fortunately, we do not see any evidence of organ damage or dysfunction in your urine is not leaking out any protein.  This means that you need to see your OB doctor or your primary care doctor for your blood pressure management within the next week.  Additionally you also had complained of headaches.  CT scan of your brain is not showing any abnormalities.  However, having headaches for the last several days is not normal and we would like you to see our neurologist for further recommendations.  Please return to the ER if the headache gets severe and in not improving, you have associated new one sided numbness, tingling, weakness or confusion, seizures, poor balance or poor vision. Please take there amlodipine for BP and fioricet for your headaches.

## 2019-05-18 NOTE — ED Provider Notes (Addendum)
[facility]-EMERGENCY DEPT Provider Note   CSN: [id number] Arrival date & time: 05/18/19  1802     History Chief Complaint  Patient presents with  . Hypertension    Brianna Bender is a 19 y.o. female.  HPI    19 year old female comes in a chief complaint of elevated blood pressure.  Patient has history of sickle cell trait, asthma and she is about 1 week postpartum.  She reports that she was discharged on 26th March.  She is G1 P1 and had SVD.  Patient reports that during her pregnancy she might have had slightly elevated blood pressure and was started on BP medications.  After delivering her baby she started having some headache.  Her headaches were mild initially but over time have progressed.  The headaches are constant, left-sided, pressure-like pain.  There is no photophobia, phonophobia.  Patient's pain is radiating to the back of her head.  She has no difficulty in moving her neck.  She denies any focal numbness, weakness, dizziness, vision change.  There is no history of clotting.  There is no family history of brain aneurysms or bleed.  Patient had epidural prior to her C-section but denies any known complications from it.  The headache is not positional.  Past Medical History:  Diagnosis Date  . Asthma   . Sickle cell trait (HCC) 02/25/2019   QTrimester urine culture - 2nd trimester negative.    [ ]  3rd tri Given genetic counselor info to discuss FOB testing    Patient Active Problem List   Diagnosis Date Noted  . Anemia 05/11/2019  . Gestational hypertension 05/09/2019  . Asthma affecting pregnancy in third trimester 04/17/2019  . Frequent headaches 04/03/2019  . Sickle cell trait (HCC) 02/25/2019  . Late prenatal care affecting pregnancy in second trimester 02/24/2019  . Supervision of low-risk pregnancy 02/12/2019  . 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 Suicidal ideation 11/18/2016  . Asthma  exacerbation 05/06/2013    Past Surgical History:  Procedure Laterality Date  . WISDOM TOOTH EXTRACTION       OB History    Gravida  2   Para  1   Term  1   Preterm  0   AB  1   Living  1     SAB  0   TAB  1   Ectopic  0   Multiple  0   Live Births  1           Family History  Problem Relation Age of Onset  . Healthy Mother   . Healthy Father     Social History   Tobacco Use  . Smoking status: Never Smoker  . Smokeless tobacco: Never Used  Substance Use Topics  . Alcohol use: Not Currently  . Drug use: Not Currently    Types: Marijuana    Home Medications Prior to Admission medications   Medication Sig Start Date End Date Taking? Authorizing Provider  acetaminophen (TYLENOL) 325 MG tablet Take 2 tablets (650 mg total) by mouth every 6 (six) hours as needed (for pain scale < 4). 05/12/19  Yes Fair, 05/14/19, MD  albuterol (VENTOLIN HFA) 108 (90 Base) MCG/ACT inhaler Inhale 1-2 puffs into the lungs every 6 (six) hours as needed for wheezing or shortness of breath. 04/17/19  Yes 06/17/19, MD  amLODipine (NORVASC) 5 MG tablet Take 1 tablet (5 mg total) by mouth daily. 05/12/19  Yes Fair, 05/14/19,  MD  ferrous sulfate 325 (65 FE) MG tablet Take 1 tablet (325 mg total) by mouth every other day. 05/12/19  Yes Fair, Hoyle Sauer, MD  ibuprofen (ADVIL) 600 MG tablet Take 1 tablet (600 mg total) by mouth every 6 (six) hours. 05/12/19  Yes Fair, Hoyle Sauer, MD  norethindrone (MICRONOR) 0.35 MG tablet Take 1 tablet (0.35 mg total) by mouth daily. 05/12/19  Yes Fair, Hoyle Sauer, MD  cyclobenzaprine (FLEXERIL) 10 MG tablet Take 1 tablet (10 mg total) by mouth every 8 (eight) hours as needed (for headaches). Patient not taking: Reported on 05/18/2019 04/03/19   Rasch, Harolyn Rutherford, NP  Prenatal MV-Min-FA-Omega-3 (PRENATAL GUMMIES/DHA & FA) 0.4-32.5 MG CHEW Chew 1 each by mouth daily. Patient not taking: Reported on 05/18/2019 04/03/19   Rasch, Harolyn Rutherford, NP    Allergies     Patient has no known allergies.  Review of Systems   Review of Systems  Constitutional: Positive for activity change.  Eyes: Negative for photophobia and visual disturbance.  Respiratory: Negative for shortness of breath.   Cardiovascular: Negative for chest pain.  Gastrointestinal: Negative for nausea and vomiting.  Neurological: Positive for headaches. Negative for dizziness, facial asymmetry, speech difficulty, weakness, light-headedness and numbness.  All other systems reviewed and are negative.   Physical Exam Updated Vital Signs BP (!) 165/99   Pulse (!) 56   Temp 98 F (36.7 C) (Oral)   Resp 18   Ht 5\' 2"  (1.575 m)   Wt 107 kg   LMP 08/22/2018 (Exact Date)   SpO2 98%   BMI 43.16 kg/m   Physical Exam Vitals and nursing note reviewed.  Constitutional:      Appearance: She is well-developed.  HENT:     Head: Normocephalic and atraumatic.  Eyes:     Extraocular Movements: Extraocular movements intact.     Pupils: Pupils are equal, round, and reactive to light.     Comments: No papilledema  Neck:     Comments: No meningismus Cardiovascular:     Rate and Rhythm: Normal rate.  Pulmonary:     Effort: Pulmonary effort is normal.  Abdominal:     General: Bowel sounds are normal.  Musculoskeletal:     Cervical back: Normal range of motion and neck supple.  Skin:    General: Skin is warm and dry.  Neurological:     General: No focal deficit present.     Mental Status: She is alert and oriented to person, place, and time.     Cranial Nerves: No cranial nerve deficit.     Sensory: No sensory deficit.     Motor: No weakness.     Comments: Cerebellar exam is normal (finger to nose) Sensory exam normal for bilateral upper and lower extremities - and patient is able to discriminate between sharp and dull. Motor exam is 4+/5      ED Results / Procedures / Treatments   Labs (all labs ordered are listed, but only abnormal results are displayed) Labs Reviewed    COMPREHENSIVE METABOLIC PANEL - Abnormal; Notable for the following components:      Result Value   Creatinine, Ser 1.06 (*)    Albumin 3.2 (*)    AST 14 (*)    Total Bilirubin 0.2 (*)    All other components within normal limits  CBC WITH DIFFERENTIAL/PLATELET - Abnormal; Notable for the following components:   WBC 11.1 (*)    RBC 3.80 (*)    Hemoglobin 9.4 (*)  HCT 29.5 (*)    MCV 77.6 (*)    MCH 24.7 (*)    RDW 15.9 (*)    Platelets 505 (*)    nRBC 0.5 (*)    Abs Immature Granulocytes 0.34 (*)    All other components within normal limits  URINALYSIS, ROUTINE W REFLEX MICROSCOPIC - Abnormal; Notable for the following components:   Hgb urine dipstick LARGE (*)    Leukocytes,Ua LARGE (*)    Bacteria, UA RARE (*)    All other components within normal limits  SEDIMENTATION RATE - Abnormal; Notable for the following components:   Sed Rate 44 (*)    All other components within normal limits  C-REACTIVE PROTEIN - Abnormal; Notable for the following components:   CRP 1.9 (*)    All other components within normal limits    EKG None  Radiology CT Angio Head W or Wo Contrast  Result Date: 05/18/2019 CLINICAL DATA:  Headache, post partum 05/10/2019 EXAM: CT ANGIOGRAPHY HEAD TECHNIQUE: Multidetector CT imaging of the head was performed using the standard protocol during bolus administration of intravenous contrast. Multiplanar CT image reconstructions and MIPs were obtained to evaluate the vascular anatomy. CONTRAST:  OMNIPAQUE IOHEXOL 350 MG/ML SOLN COMPARISON:  None. FINDINGS: CT HEAD Brain: No acute intracranial hemorrhage, mass effect, or edema. Gray-white differentiation is preserved ventricles and sulci are normal in size and configuration. No extra-axial fluid collection. Vascular: No hyperdense vessel or unexpected calcification. Skull: Unremarkable. Sinuses: Minor mucosal thickening. Orbits: Unremarkable. CTA HEAD Anterior circulation: Intracranial internal carotid  arteries are patent. Anterior and middle cerebral arteries are patent. Posterior circulation: Intracranial vertebral, basilar, and posterior cerebral arteries are patent. Bilateral posterior communicating arteries are present. Venous sinuses: As permitted by contrast timing, patent. IMPRESSION: No acute intracranial abnormality. Normal vascular imaging of the head. Electronically Signed   By: Guadlupe Spanish M.D.   On: 05/18/2019 22:21    Procedures Procedures (including critical care time)  Medications Ordered in ED Medications  sodium chloride (PF) 0.9 % injection (has no administration in time range)  acetaminophen (TYLENOL) tablet 650 mg (650 mg Oral Given 05/18/19 1919)  metoCLOPramide (REGLAN) injection 10 mg (10 mg Intravenous Given 05/18/19 2000)  magnesium sulfate IVPB 2 g 50 mL (0 g Intravenous Stopped 05/18/19 2103)  iohexol (OMNIPAQUE) 350 MG/ML injection 100 mL (100 mLs Intravenous Contrast Given 05/18/19 2135)    ED Course  I have reviewed the triage vital signs and the nursing notes.  Pertinent labs & imaging results that were available during my care of the patient were reviewed by me and considered in my medical decision making (see chart for details).  Clinical Course as of May 17 2325  Sat May 18, 2019  2235 Spoke with Dr. Allena Katz.  He informed me that the CT tech was unable to get CT angio neck.  I confirmed this with the tech -who said that he forgot to get CT angio neck and was not given permission to give more contrast.  CT angiogram of the head is completely normal.  Dr. Allena Katz I discussed the case.  He is asking me if patient needs angiogram of the neck, if so she will need MRI.  My suspicion for dissection of the neck is low, as patient's primary complaint is headache with pain moving to her neck.  I think what we will do is have her follow-up with neurologist as an outpatient, and if there is concerns for chronic or subacute dissection then they can get CT  angio or MR-A.   [AN]      Clinical Course User Index [AN] Varney Biles, MD   MDM Rules/Calculators/A&P                      19 year old comes in a chief complaint of headaches and elevated blood pressure. It appears that she does not have preeclampsia as a diagnosis with gestational hypertension.  She was started on amlodipine.  Headaches appear to have come about after she delivered the baby, while she was still in the hospital.  On exam there is no focal neuro deficits. I do not have any evidence of retinal hemorrhage or papilledema on the limited bedside ophthalmic exam.  There is no meningismus.  Differential diagnosis includes preeclampsia.  Appropriate labs ordered.  She could also have conditions like cerebral venous thrombosis, cerebral aneurysm.  The headache does not appear to be thunderclap, but subarachnoid hemorrhage is also possible.  There is no risk factors for [facility] however.  Reassessment: Patient does not have preeclampsia.  Urine is completely clean, without any proteinuria and there is no changes to her kidney function or LFTs. Teleneurology has been consulted for headaches.  They recommend CT angio head and neck.   Reassessment: CT angiogram of the head is normal.  Patient reassessed.  She did not get CT angio neck.  She denies any neck pain.  She does continues to have no meningismus and her neuro exam is nonfocal.  I do not think a CT angio neck is needed as my suspicion for dissection over her neck is extremely low.  Additionally, we also discussed LP to rule out subarachnoid hemorrhage.  She had traumatic epidural and does not wish to go with LP.  I informed her that negative LP would 100% rule out subarachnoid and a CT angio at best is about 95% sensitive, although the sensitivity for ruling out aneurysm is much lower.  She understands the probability is in prefers going home and being with her baby.  Her headache has improved in the ED.  She has been requested to follow-up with both  neurology and PCP for optimal BP management. I discussed the case with Dr. Allean Found, [facility] and he recommended adding Fioricet.  He also advised patient to keep taking her amlodipine.  These recs were also shared with the patient.  Final Clinical Impression(s) / ED Diagnoses Final diagnoses:  Acute nonintractable headache, unspecified headache type  Postpartum hypertension    Rx / DC Orders ED Discharge Orders    None       Varney Biles, MD 05/18/19 4401    Varney Biles, MD 05/19/19 0272

## 2019-05-18 NOTE — ED Notes (Signed)
Pt lying in bed. Breast pump placed at bedside with instructions for use. Pt denies any other needs. Will continue to monitor.

## 2019-05-18 NOTE — ED Notes (Signed)
Pt sitting up in bed talking on the phone. Pt and family updated on plan of care. Pt denies any needs. Will continue to monitor.

## 2019-05-18 NOTE — ED Notes (Signed)
Pt sitting up in bed. NAD noted. Crackers and water given. Will continue to monitor.

## 2019-05-18 NOTE — ED Triage Notes (Signed)
Per patient, she had baby on 3/26 and ever since has had a headache. Patient states it got worse today and her blood pressure has been high. Patient says her labor was induced to prevent bp from getting any higher, and states she did have an issue with it rising during labor. Headache pain rated 7/10

## 2019-05-19 MED ORDER — BUTALBITAL-APAP-CAFFEINE 50-325-40 MG PO TABS: 1.0000 | ORAL_TABLET | Freq: Four times a day (QID) | ORAL | 0 refills | Status: DC | PRN

## 2019-05-19 NOTE — ED Notes (Signed)
Pt awake in bed. MD aware of pt BP of 166/10. Ok for d/c. NAD noted.

## 2019-05-23 ENCOUNTER — Encounter: Payer: BLUE CROSS/BLUE SHIELD | Admitting: Obstetrics and Gynecology

## 2019-05-29 ENCOUNTER — Inpatient Hospital Stay (HOSPITAL_COMMUNITY): Admission: RE | Admit: 2019-05-29 | Payer: BLUE CROSS/BLUE SHIELD | Source: Home / Self Care

## 2019-06-06 ENCOUNTER — Inpatient Hospital Stay (HOSPITAL_COMMUNITY)
Admission: AD | Admit: 2019-06-06 | Discharge: 2019-06-06 | Disposition: A | Discharge status: Home / Self Care | Payer: BLUE CROSS/BLUE SHIELD | Attending: Obstetrics and Gynecology | Admitting: Obstetrics and Gynecology

## 2019-06-06 ENCOUNTER — Encounter (HOSPITAL_COMMUNITY): Payer: Self-pay | Admitting: Emergency Medicine

## 2019-06-06 DIAGNOSIS — Z3A37 37 weeks gestation of pregnancy: Secondary | ICD-10-CM | POA: Insufficient documentation

## 2019-06-06 DIAGNOSIS — R102 Pelvic and perineal pain: Secondary | ICD-10-CM | POA: Insufficient documentation

## 2019-06-06 DIAGNOSIS — O10913 Unspecified pre-existing hypertension complicating pregnancy, third trimester: Secondary | ICD-10-CM | POA: Diagnosis not present

## 2019-06-06 DIAGNOSIS — I10 Essential (primary) hypertension: Secondary | ICD-10-CM

## 2019-06-06 DIAGNOSIS — N898 Other specified noninflammatory disorders of vagina: Secondary | ICD-10-CM

## 2019-06-06 DIAGNOSIS — O26893 Other specified pregnancy related conditions, third trimester: Secondary | ICD-10-CM | POA: Insufficient documentation

## 2019-06-06 DIAGNOSIS — O9089 Other complications of the puerperium, not elsewhere classified: Secondary | ICD-10-CM | POA: Diagnosis not present

## 2019-06-06 LAB — ETHANOL: Alcohol, Ethyl (B): <10 mg/dL (ref <10)

## 2019-06-06 LAB — CBC WITH DIFFERENTIAL/PLATELET
Abs Immature Granulocytes: 0.01 10*3/uL (ref 0.00–0.07)
Basophils Absolute: 0 10*3/uL (ref 0.0–0.1)
Basophils Relative: 1 %
Eosinophils Absolute: 0.2 10*3/uL (ref 0.0–0.5)
Eosinophils Relative: 4 %
HCT: 34.3 % — ABNORMAL LOW (ref 36.0–46.0)
Hemoglobin: 11 g/dL — ABNORMAL LOW (ref 12.0–15.0)
Immature Granulocytes: 0 %
Lymphocytes Relative: 37 %
Lymphs Abs: 1.8 10*3/uL (ref 0.7–4.0)
MCH: 24.3 pg — ABNORMAL LOW (ref 26.0–34.0)
MCHC: 32.1 g/dL (ref 30.0–36.0)
MCV: 75.7 fL — ABNORMAL LOW (ref 80.0–100.0)
Monocytes Absolute: 0.4 10*3/uL (ref 0.1–1.0)
Monocytes Relative: 8 %
Neutro Abs: 2.4 10*3/uL (ref 1.7–7.7)
Neutrophils Relative %: 50 %
Platelets: 493 10*3/uL — ABNORMAL HIGH (ref 150–400)
RBC: 4.53 MIL/uL (ref 3.87–5.11)
RDW: 15.9 % — ABNORMAL HIGH (ref 11.5–15.5)
WBC: 4.8 10*3/uL (ref 4.0–10.5)
nRBC: 0 % (ref 0.0–0.2)

## 2019-06-06 LAB — COMPREHENSIVE METABOLIC PANEL
ALT: 16 U/L (ref 0–44)
AST: 18 U/L (ref 15–41)
Albumin: 3.8 g/dL (ref 3.5–5.0)
Alkaline Phosphatase: 82 U/L (ref 38–126)
Anion gap: 9 (ref 5–15)
BUN: 9 mg/dL (ref 6–20)
CO2: 24 mmol/L (ref 22–32)
Calcium: 9.5 mg/dL (ref 8.9–10.3)
Chloride: 107 mmol/L (ref 98–111)
Creatinine, Ser: 0.98 mg/dL (ref 0.44–1.00)
GFR calc Af Amer: >60 mL/min (ref >60)
GFR calc non Af Amer: >60 mL/min (ref >60)
Glucose, Bld: 93 mg/dL (ref 70–99)
Potassium: 4 mmol/L (ref 3.5–5.1)
Sodium: 140 mmol/L (ref 135–145)
Total Bilirubin: 0.8 mg/dL (ref 0.3–1.2)
Total Protein: 7.1 g/dL (ref 6.5–8.1)

## 2019-06-06 LAB — URINALYSIS, ROUTINE W REFLEX MICROSCOPIC
Bilirubin Urine: NEGATIVE
Glucose, UA: 50 mg/dL — AB
Ketones, ur: NEGATIVE mg/dL
Nitrite: NEGATIVE
Protein, ur: NEGATIVE mg/dL
Specific Gravity, Urine: 1.01 (ref 1.005–1.030)
pH: 7 (ref 5.0–8.0)

## 2019-06-06 LAB — MAGNESIUM: Magnesium: 1.9 mg/dL (ref 1.7–2.4)

## 2019-06-06 LAB — LIPASE, BLOOD: Lipase: 23 U/L (ref 11–51)

## 2019-06-06 MED ORDER — AMLODIPINE BESYLATE 10 MG PO TABS
10.0000 mg | ORAL_TABLET | Freq: Once | ORAL | Status: AC
  Administered 2019-06-06: 10 mg via ORAL
  Filled 2019-06-06: qty 1

## 2019-06-06 MED ORDER — HYDRALAZINE HCL 20 MG/ML IJ SOLN
10.0000 mg | INTRAMUSCULAR | Status: AC
  Administered 2019-06-06: 19:00:00 10 mg via INTRAVENOUS
  Filled 2019-06-06: qty 1

## 2019-06-06 MED ORDER — AMLODIPINE BESYLATE 10 MG PO TABS: 10.0000 mg | ORAL_TABLET | Freq: Every day | ORAL | 1 refills | Status: DC

## 2019-06-06 NOTE — ED Notes (Signed)
[facility] called. 

## 2019-06-06 NOTE — Discharge Instructions (Signed)
Preeclampsia and Eclampsia Preeclampsia is a serious condition that may develop during pregnancy. This condition causes high blood pressure and increased protein in your urine along with other symptoms, such as headaches and vision changes. These symptoms may develop as the condition gets worse. Preeclampsia may occur at 20 weeks of pregnancy or later. Diagnosing and treating preeclampsia early is very important. If not treated early, it can cause serious problems for you and your baby. One problem it can lead to is eclampsia. Eclampsia is a condition that causes muscle jerking or shaking (convulsions or seizures) and other serious problems for the mother. During pregnancy, delivering your baby may be the best treatment for preeclampsia or eclampsia. For most women, preeclampsia and eclampsia symptoms go away after giving birth. In rare cases, a woman may develop preeclampsia after giving birth (postpartum preeclampsia). This usually occurs within 48 hours after childbirth but may occur up to 6 weeks after giving birth. What are the causes? The cause of preeclampsia is not known. What increases the risk? The following risk factors make you more likely to develop preeclampsia:  Being pregnant for the first time.  Having had preeclampsia during a past pregnancy.  Having a family history of preeclampsia.  Having high blood pressure.  Being pregnant with more than one baby.  Being 8 or older.  Being African-American.  Having kidney disease or diabetes.  Having medical conditions such as lupus or blood diseases.  Being very overweight (obese). What are the signs or symptoms? The most common symptoms are:  Severe headaches.  Vision problems, such as blurred or double vision.  Abdominal pain, especially upper abdominal pain. Other symptoms that may develop as the condition gets worse include:  Sudden weight gain.  Sudden swelling of the hands, face, legs, and  feet.  Severe nausea and vomiting.  Numbness in the face, arms, legs, and feet.  Dizziness.  Urinating less than usual.  Slurred speech.  Convulsions or seizures. How is this diagnosed? There are no screening tests for preeclampsia. Your health care provider will ask you about symptoms and check for signs of preeclampsia during your prenatal visits. You may also have tests that include:  Checking your blood pressure.  Urine tests to check for protein. Your health care provider will check for this at every prenatal visit.  Blood tests.  Monitoring your baby's heart rate.  Ultrasound. How is this treated? You and your health care provider will determine the treatment approach that is best for you. Treatment may include:  Having more frequent prenatal exams to check for signs of preeclampsia, if you have an increased risk for preeclampsia.  Medicine to lower your blood pressure.  Staying in the hospital, if your condition is severe. There, treatment will focus on controlling your blood pressure and the amount of fluids in your body (fluid retention).  Taking medicine (magnesium sulfate) to prevent seizures. This may be given as an injection or through an IV.  Taking a low-dose aspirin during your pregnancy.  Delivering your baby early. You may have your labor started with medicine (induced), or you may have a cesarean delivery. Follow these instructions at home: Eating and drinking   Drink enough fluid to keep your urine pale yellow.  Avoid caffeine. Lifestyle  Do not use any products that contain nicotine or tobacco, such as cigarettes and e-cigarettes. If you need help quitting, ask your health care provider.  Do not use alcohol or drugs.  Avoid stress as  plenty of sleep. General instructions  Take over-the-counter and prescription medicines only as told by your health care provider.  When lying down, lie on your left side. This keeps pressure off your  major blood vessels.  When sitting or lying down, raise (elevate) your feet. Try putting some pillows underneath your lower legs.  Exercise regularly. Ask your health care provider what kinds of exercise are best for you.  Keep all follow-up and prenatal visits as told by your health care provider. This is important. How is this prevented? There is no known way of preventing preeclampsia or eclampsia from developing. However, to lower your risk of complications and detect problems early:  Get regular prenatal care. Your health care provider may be able to diagnose and treat the condition early.  Maintain a healthy weight. Ask your health care provider for help managing weight gain during pregnancy.  Work with your health care provider to manage any long-term (chronic) health conditions you have, such as diabetes or kidney problems.  You may have tests of your blood pressure and kidney function after giving birth.  Your health care provider may have you take low-dose aspirin during your next pregnancy. Contact a health care provider if:  You have symptoms that your health care provider told you may require more treatment or monitoring, such as: ? Headaches. ? Nausea or vomiting. ? Abdominal pain. ? Dizziness. ? Light-headedness. Get help right away if:  You have severe: ? Abdominal pain. ? Headaches that do not get better. ? Dizziness. ? Vision problems. ? Confusion. ? Nausea or vomiting.  You have any of the following: ? A seizure. ? Sudden, rapid weight gain. ? Sudden swelling in your hands, ankles, or face. ? Trouble moving any part of your body. ? Numbness in any part of your body. ? Trouble speaking. ? Abnormal bleeding.  You faint. Summary  Preeclampsia is a serious condition that may develop during pregnancy.  This condition causes high blood pressure and increased protein in your urine along with other symptoms, such as headaches and vision  changes.  Diagnosing and treating preeclampsia early is very important. If not treated early, it can cause serious problems for you and your baby.  Get help right away if you have symptoms that your health care provider told you to watch for. This information is not intended to replace advice given to you by your health care provider. Make sure you discuss any questions you have with your health care provider. Document Revised: 10/03/2017 Document Reviewed: 09/07/2015 Elsevier Patient Education  2020 [employer] Postpartum Hypertension Postpartum hypertension is high blood pressure that remains higher than normal after childbirth. You may not realize that you have postpartum hypertension if your blood pressure is not being checked regularly. In most cases, postpartum hypertension will go away on its own, usually within a week of delivery. However, for some women, medical treatment is required to prevent serious complications, such as seizures or stroke. What are the causes? This condition may be caused by one or more of the following:  Hypertension that existed before pregnancy (chronic hypertension).  Hypertension that comes on as a result of pregnancy (gestational hypertension).  Hypertensive disorders during pregnancy (preeclampsia) or seizures in women who have high blood pressure during pregnancy (eclampsia).  A condition in which the liver, platelets, and red blood cells are damaged during pregnancy (HELLP syndrome).  A condition in which the thyroid produces too much hormones (hyperthyroidism).  Other rare problems of the nerves (neurological disorders)   problems of the nerves (neurological disorders) or blood disorders. In some cases, the cause may not be known. What increases the risk? The following factors may make you more likely to develop this condition:  Chronic hypertension. In some cases, this may not have been diagnosed before pregnancy.  Obesity.  Type 2 diabetes.  Kidney disease.  History of  preeclampsia or eclampsia.  Other medical conditions that change the level of hormones in the body (hormonal imbalance). What are the signs or symptoms? As with all types of hypertension, postpartum hypertension may not have any symptoms. Depending on how high your blood pressure is, you may experience:  Headaches. These may be mild, moderate, or severe. They may also be steady, constant, or sudden in onset (thunderclap headache).  Changes in your ability to see (visual changes).  Dizziness.  Shortness of breath.  Swelling of your hands, feet, lower legs, or face. In some cases, you may have swelling in more than one of these locations.  Heart palpitations or a racing heartbeat.  Difficulty breathing while lying down.  Decrease in the amount of urine that you pass. Other rare signs and symptoms may include:  Sweating more than usual. This lasts longer than a few days after delivery.  Chest pain.  Sudden dizziness when you get up from sitting or lying down.  Seizures.  Nausea or vomiting.  Abdominal pain. How is this diagnosed? This condition may be diagnosed based on the results of a physical exam, blood pressure measurements, and blood and urine tests. You may also have other tests, such as a CT scan or an MRI, to check for other problems of postpartum hypertension. How is this treated? If blood pressure is high enough to require treatment, your options may include:  Medicines to reduce blood pressure (antihypertensives). Tell your health care provider if you are breastfeeding or if you plan to breastfeed. There are many antihypertensive medicines that are safe to take while breastfeeding.  Stopping medicines that may be causing hypertension.  Treating medical conditions that are causing hypertension.  Treating the complications of hypertension, such as seizures, stroke, or kidney problems. Your health care provider will also continue to monitor your blood pressure  closely until it is within a safe range for you. Follow these instructions at home:  Take over-the-counter and prescription medicines only as told by your health care provider.  Return to your normal activities as told by your health care provider. Ask your health care provider what activities are safe for you.  Do not use any products that contain nicotine or tobacco, such as cigarettes and e-cigarettes. If you need help quitting, ask your health care provider.  Keep all follow-up visits as told by your health care provider. This is important. Contact a health care provider if:  Your symptoms get worse.  You have new symptoms, such as: ? A headache that does not get better. ? Dizziness. ? Visual changes. Get help right away if:  You suddenly develop swelling in your hands, ankles, or face.  You have sudden, rapid weight gain.  You develop difficulty breathing, chest pain, racing heartbeat, or heart palpitations.  You develop severe pain in your abdomen.  You have any symptoms of a stroke. "BE FAST" is an easy way to remember the main warning signs of a stroke: ? B - Balance. Signs are dizziness, sudden trouble walking, or loss of balance. ? E - Eyes. Signs are trouble seeing or a sudden change in vision. ? F - Face.  Signs are sudden weakness or numbness of the face, or the face or eyelid drooping on one side. ? A - Arms. Signs are weakness or numbness in an arm. This happens suddenly and usually on one side of the body. ? S - Speech. Signs are sudden trouble speaking, slurred speech, or trouble understanding what people say. ? T - Time. Time to call emergency services. Write down what time symptoms started.  You have other signs of a stroke, such as: ? A sudden, severe headache with no known cause. ? Nausea or vomiting. ? Seizure. These symptoms may represent a serious problem that is an emergency. Do not wait to see if the symptoms will go away. Get medical help right away.  Call your local emergency services (911 in the [place] Do not drive yourself to the hospital. Summary  Postpartum hypertension is high blood pressure that remains higher than normal after childbirth.  In most cases, postpartum hypertension will go away on its own, usually within a week of delivery.  For some women, medical treatment is required to prevent serious complications, such as seizures or stroke. This information is not intended to replace advice given to you by your health care provider. Make sure you discuss any questions you have with your health care provider. Document Revised: 03/09/2018 Document Reviewed: 11/21/2016 Elsevier Patient Education  2020 [employer].

## 2019-06-06 NOTE — ED Provider Notes (Signed)
[facility] DEPT Provider Note   CSN: [id number] Arrival date & time: 06/06/19  1536     History Chief Complaint  Patient presents with  . Vaginal Pain    Brianna Bender is a 19 y.o. female.  HPI  19 year old female, 3 weeks postpartum, delivered on 3/27, vaginal delivery.  Patient presented today due to vaginal pain.  She notes that for the last 2 days she has had vaginal burning.  She looked with a mirror and states she saw a cut.  She called her OB/GYN who recommended she come to the ED for further evaluation.  Of note patient's had gestational hypertension.  She is currently on amlodipine and states she has been compliant with this medication.  He denies headache, blurry vision, chest pain, shortness of breath, abdominal pain, nausea, vomiting, seeing spots.     Past Medical History:  Diagnosis Date  . Asthma   . Sickle cell trait ([place]) 02/25/2019   QTrimester urine culture - 2nd trimester negative.    [ ]  3rd tri Given genetic counselor info to discuss FOB testing    Patient Active Problem List   Diagnosis Date Noted  . Anemia 05/11/2019  . Gestational hypertension 05/09/2019  . Asthma affecting pregnancy in third trimester 04/17/2019  . Frequent headaches 04/03/2019  . Sickle cell trait ([place]) 02/25/2019  . Late prenatal care affecting pregnancy in second trimester 02/24/2019  . Supervision of low-risk pregnancy 02/12/2019  . 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 Suicidal ideation 11/18/2016  . Asthma exacerbation 05/06/2013    Past Surgical History:  Procedure Laterality Date  . WISDOM TOOTH EXTRACTION       OB History    Gravida  2   Para  1   Term  1   Preterm  0   AB  1   Living  1     SAB  0   TAB  1   Ectopic  0   Multiple  0   Live Births  1           Family History  Problem Relation Age of Onset  . Healthy Mother   . Healthy Father     Social  History   Tobacco Use  . Smoking status: Never Smoker  . Smokeless tobacco: Never Used  Substance Use Topics  . Alcohol use: Not Currently  . Drug use: Not Currently    Types: Marijuana    Home Medications Prior to Admission medications   Medication Sig Start Date End Date Taking? Authorizing Provider  acetaminophen (TYLENOL) 325 MG tablet Take 2 tablets (650 mg total) by mouth every 6 (six) hours as needed (for pain scale < 4). 05/12/19  Yes Fair, Marin Shutter, MD  albuterol (VENTOLIN HFA) 108 (90 Base) MCG/ACT inhaler Inhale 1-2 puffs into the lungs every 6 (six) hours as needed for wheezing or shortness of breath. 04/17/19  Yes Aletha Halim, MD  amLODipine (NORVASC) 5 MG tablet Take 1 tablet (5 mg total) by mouth daily. 05/12/19  Yes Fair, Marin Shutter, MD  Prenatal MV-Min-FA-Omega-3 (PRENATAL GUMMIES/DHA & FA) 0.4-32.5 MG CHEW Chew 1 each by mouth daily. 04/03/19  Yes Rasch, Artist Pais, NP  butalbital-acetaminophen-caffeine (FIORICET) 50-325-40 MG tablet Take 1-2 tablets by mouth every 6 (six) hours as needed for headache. 05/19/19 05/18/20  Varney Biles, MD  cyclobenzaprine (FLEXERIL) 10 MG tablet Take 1 tablet (10 mg total) by mouth every 8 (eight) hours as needed (for  headaches). Patient not taking: Reported on 05/18/2019 04/03/19   Rasch, Victorino Dike I, NP  ferrous sulfate 325 (65 FE) MG tablet Take 1 tablet (325 mg total) by mouth every other day. Patient not taking: Reported on 06/06/2019 05/12/19   Joselyn Arrow, MD  ibuprofen (ADVIL) 600 MG tablet Take 1 tablet (600 mg total) by mouth every 6 (six) hours. Patient not taking: Reported on 06/06/2019 05/12/19   Joselyn Arrow, MD  norethindrone (MICRONOR) 0.35 MG tablet Take 1 tablet (0.35 mg total) by mouth daily. 05/12/19   FairHoyle Sauer, MD    Allergies    Patient has no known allergies.  Review of Systems   Review of Systems  Constitutional: Negative for chills and fever.  Respiratory: Negative for shortness of breath.    Cardiovascular: Negative for chest pain.  Gastrointestinal: Negative for abdominal pain, nausea and vomiting.  Genitourinary: Positive for vaginal pain.    Physical Exam Updated Vital Signs BP (!) 171/81   Pulse 70   Temp 98.5 F (36.9 C)   Resp 17   LMP 08/22/2018 (Exact Date)   SpO2 99%   Physical Exam Vitals and nursing note reviewed. Exam conducted with a chaperone present.  Constitutional:      Appearance: She is well-developed.  HENT:     Head: Normocephalic and atraumatic.  Eyes:     Conjunctiva/sclera: Conjunctivae normal.  Cardiovascular:     Rate and Rhythm: Normal rate and regular rhythm.     Heart sounds: Normal heart sounds. No murmur.  Pulmonary:     Effort: Pulmonary effort is normal. No respiratory distress.     Breath sounds: Normal breath sounds. No wheezing or rales.  Abdominal:     General: Bowel sounds are normal. There is no distension.     Palpations: Abdomen is soft.     Tenderness: There is no abdominal tenderness.  Genitourinary:   Musculoskeletal:        General: No tenderness or deformity. Normal range of motion.     Cervical back: Neck supple.  Skin:    General: Skin is warm and dry.     Findings: No erythema or rash.  Neurological:     Mental Status: She is alert and oriented to person, place, and time.  Psychiatric:        Behavior: Behavior normal.     ED Results / Procedures / Treatments   Labs (all labs ordered are listed, but only abnormal results are displayed) Labs Reviewed - No data to display  EKG None  Radiology No results found.  Procedures Procedures (including critical care time)  Medications Ordered in ED Medications - No data to display  ED Course  I have reviewed the triage vital signs and the nursing notes.  Pertinent labs & imaging results that were available during my care of the patient were reviewed by me and considered in my medical decision making (see chart for details).    MDM  Rules/Calculators/A&P                       Presented with vaginal pain.  On physical exam she does have a 1 cm laceration noted with one visible suture.  Of note patient hypertensive.  Called and discussed case with OB on-call, Dr. Vergie Living.  Patient had a work-up for preeclampsia at the beginning of the month.  However he is concerned given her persistent elevated blood pressures and lack of follow-up.  He recommends blood work,  hydralazine 10 mg IV, every 30 minute blood pressure checks and transfer to the MAU.    Final Clinical Impression(s) / ED Diagnoses Final diagnoses:  None    Rx / DC Orders ED Discharge Orders    None       Rueben Bash 06/06/19 2320    Gerhard Munch, MD 06/10/19 0000

## 2019-06-06 NOTE — MAU Note (Signed)
Pt transferred from [facility] via [facility] for PP pre-e workup. Pt reports HTN during pregnancy. Was sent home on BP meds. Pt reports she takes daily, but missed her medication this morning. Pt denies HA, vision changes or RUQ pain.

## 2019-06-06 NOTE — ED Triage Notes (Signed)
Pt reports had a baby several weeks ago. Reports that over the past few days had vaginal burning and unsure if her stitches or if "it came open again, I looked in the mirror and new I needed to be seen." states OB advised to go to ED.

## 2019-06-06 NOTE — MAU Provider Note (Signed)
History     CSN: [id number]  Arrival date and time: 06/06/19 1536   First Provider Initiated Contact with Patient 06/06/19 2135      Chief Complaint  Patient presents with  . Vaginal Pain   [name] is a 19 y.o. G2P1011 at [redacted]w[redacted]d who presents to MAU for preeclampsia evaluation after she was seen at [facility] ED for evaluation of her perineal stitching and found to have increased blood pressure incidentally. Wonda Olds consulted with Dr. Vergie Living who advised the patient should be transferred to [facility] for evaluation. Patient delivered on 05/11/2019. Patient reports she has been taking her Amlodipine 5mg  daily, as prescribed, but did not take it today. Patient also had a previous visit to [facility] on 05/18/2019, but was not transferred to MAU for evaluation.  Pt denies HA, blurry vision/seeing spots, N/V, epigastric pain, swelling in face and hands, sudden weight gain. Pt denies chest pain and SOB.  Pt denies constipation, diarrhea, or urinary problems. Pt denies fever, chills, fatigue, sweating or changes in appetite. Pt denies dizziness, light-headedness, weakness.   OB History    Gravida  2   Para  1   Term  1   Preterm  0   AB  1   Living  1     SAB  0   TAB  1   Ectopic  0   Multiple  0   Live Births  1           Past Medical History:  Diagnosis Date  . Asthma   . Sickle cell trait (HCC) 02/25/2019   QTrimester urine culture - 2nd trimester negative.    [ ]  3rd tri Given genetic counselor info to discuss FOB testing    Past Surgical History:  Procedure Laterality Date  . WISDOM TOOTH EXTRACTION      Family History  Problem Relation Age of Onset  . Healthy Mother   . Healthy Father     Social History   Tobacco Use  . Smoking status: Never Smoker  . Smokeless tobacco: Never Used  Substance Use Topics  . Alcohol use: Not Currently  . Drug use: Not Currently    Types: Marijuana    Allergies: No Known Allergies  No medications prior  to admission.    Review of Systems  Constitutional: Negative for chills, diaphoresis, fatigue and fever.  Eyes: Negative for visual disturbance.  Respiratory: Negative for shortness of breath.   Cardiovascular: Negative for chest pain.  Gastrointestinal: Negative for abdominal pain, constipation, diarrhea, nausea and vomiting.  Genitourinary: Negative for dysuria, flank pain, frequency, pelvic pain, urgency, vaginal bleeding and vaginal discharge.  Neurological: Negative for dizziness, weakness, light-headedness and headaches.   Physical Exam   Blood pressure 133/74, pulse 93, temperature 99 F (37.2 C), temperature source Oral, resp. rate 18, last menstrual period 08/22/2018, SpO2 100 %, unknown if currently breastfeeding.  Patient Vitals for the past 24 hrs:  BP Temp Temp src Pulse Resp SpO2  06/06/19 2201 133/74 -- -- 93 -- --  06/06/19 2137 138/76 -- -- (!) 120 -- --  06/06/19 2116 140/76 -- -- 98 -- --  06/06/19 2101 (!) 151/94 -- -- 92 -- --  06/06/19 2042 (!) 149/78 -- -- 93 -- --  06/06/19 2031 (!) 149/90 99 F (37.2 C) Oral (!) 103 18 100 %  06/06/19 1930 (!) 143/97 -- -- 69 17 100 %  06/06/19 1915 -- -- -- 65 -- 100 %  06/06/19 1900 (!)  143/101 -- -- -- -- --  06/06/19 1845 (!) 151/112 -- -- 74 -- 100 %  06/06/19 1830 (!) 164/113 -- -- (!) 57 -- 100 %  06/06/19 1826 (!) 152/106 -- -- 60 17 100 %  06/06/19 1544 (!) 171/81 98.5 F (36.9 C) -- 70 17 99 %   Physical Exam  Constitutional: She is oriented to person, place, and time. She appears well-developed and well-nourished. No distress.  HENT:  Head: Normocephalic and atraumatic.  Respiratory: Effort normal.  GI: Soft.  Genitourinary: There is no rash, tenderness or lesion on the right labia. There is no rash, tenderness or lesion on the left labia.    Genitourinary Comments: Perineal laceration appears well healed, without evidence of infection or dehiscence.   Neurological: She is alert and oriented to person,  place, and time.  Skin: Skin is warm and dry. She is not diaphoretic.  Psychiatric: She has a normal mood and affect. Her behavior is normal. Judgment and thought content normal.   Results for orders placed or performed during the hospital encounter of 06/06/19 (from the past 24 hour(s))  Comprehensive metabolic panel     Status: None   Collection Time: 06/06/19  6:54 PM  Result Value Ref Range   Sodium 140 135 - 145 mmol/L   Potassium 4.0 3.5 - 5.1 mmol/L   Chloride 107 98 - 111 mmol/L   CO2 24 22 - 32 mmol/L   Glucose, Bld 93 70 - 99 mg/dL   BUN 9 6 - 20 mg/dL   Creatinine, Ser 0.98 0.44 - 1.00 mg/dL   Calcium 9.5 8.9 - 10.3 mg/dL   Total Protein 7.1 6.5 - 8.1 g/dL   Albumin 3.8 3.5 - 5.0 g/dL   AST 18 15 - 41 U/L   ALT 16 0 - 44 U/L   Alkaline Phosphatase 82 38 - 126 U/L   Total Bilirubin 0.8 0.3 - 1.2 mg/dL   GFR calc non Af Amer >60 >60 mL/min   GFR calc Af Amer >60 >60 mL/min   Anion gap 9 5 - 15  Ethanol     Status: None   Collection Time: 06/06/19  6:54 PM  Result Value Ref Range   Alcohol, Ethyl (B) <10 <10 mg/dL  Lipase, blood     Status: None   Collection Time: 06/06/19  6:54 PM  Result Value Ref Range   Lipase 23 11 - 51 U/L  CBC with Differential     Status: Abnormal   Collection Time: 06/06/19  6:54 PM  Result Value Ref Range   WBC 4.8 4.0 - 10.5 K/uL   RBC 4.53 3.87 - 5.11 MIL/uL   Hemoglobin 11.0 (L) 12.0 - 15.0 g/dL   HCT 34.3 (L) 36.0 - 46.0 %   MCV 75.7 (L) 80.0 - 100.0 fL   MCH 24.3 (L) 26.0 - 34.0 pg   MCHC 32.1 30.0 - 36.0 g/dL   RDW 15.9 (H) 11.5 - 15.5 %   Platelets 493 (H) 150 - 400 K/uL   nRBC 0.0 0.0 - 0.2 %   Neutrophils Relative % 50 %   Neutro Abs 2.4 1.7 - 7.7 K/uL   Lymphocytes Relative 37 %   Lymphs Abs 1.8 0.7 - 4.0 K/uL   Monocytes Relative 8 %   Monocytes Absolute 0.4 0.1 - 1.0 K/uL   Eosinophils Relative 4 %   Eosinophils Absolute 0.2 0.0 - 0.5 K/uL   Basophils Relative 1 %   Basophils Absolute 0.0 0.0 -  0.1 K/uL   Immature  Granulocytes 0 %   Abs Immature Granulocytes 0.01 0.00 - 0.07 K/uL  Magnesium     Status: None   Collection Time: 06/06/19  6:54 PM  Result Value Ref Range   Magnesium 1.9 1.7 - 2.4 mg/dL  Urinalysis, Routine w reflex microscopic     Status: Abnormal   Collection Time: 06/06/19  9:35 PM  Result Value Ref Range   Color, Urine YELLOW YELLOW   APPearance HAZY (A) CLEAR   Specific Gravity, Urine 1.010 1.005 - 1.030   pH 7.0 5.0 - 8.0   Glucose, UA 50 (A) NEGATIVE mg/dL   Hgb urine dipstick MODERATE (A) NEGATIVE   Bilirubin Urine NEGATIVE NEGATIVE   Ketones, ur NEGATIVE NEGATIVE mg/dL   Protein, ur NEGATIVE NEGATIVE mg/dL   Nitrite NEGATIVE NEGATIVE   Leukocytes,Ua SMALL (A) NEGATIVE   RBC / HPF 0-5 0 - 5 RBC/hpf   WBC, UA 0-5 0 - 5 WBC/hpf   Bacteria, UA RARE (A) NONE SEEN   Squamous Epithelial / LPF 6-10 0 - 5   Hyaline Casts, UA PRESENT    CT Angio Head W or Wo Contrast  Result Date: 05/18/2019 CLINICAL DATA:  Headache, post partum 05/10/2019 EXAM: CT ANGIOGRAPHY HEAD TECHNIQUE: Multidetector CT imaging of the head was performed using the standard protocol during bolus administration of intravenous contrast. Multiplanar CT image reconstructions and MIPs were obtained to evaluate the vascular anatomy. CONTRAST:  OMNIPAQUE IOHEXOL 350 MG/ML SOLN COMPARISON:  None. FINDINGS: CT HEAD Brain: No acute intracranial hemorrhage, mass effect, or edema. Gray-white differentiation is preserved ventricles and sulci are normal in size and configuration. No extra-axial fluid collection. Vascular: No hyperdense vessel or unexpected calcification. Skull: Unremarkable. Sinuses: Minor mucosal thickening. Orbits: Unremarkable. CTA HEAD Anterior circulation: Intracranial internal carotid arteries are patent. Anterior and middle cerebral arteries are patent. Posterior circulation: Intracranial vertebral, basilar, and posterior cerebral arteries are patent. Bilateral posterior communicating arteries are  present. Venous sinuses: As permitted by contrast timing, patent. IMPRESSION: No acute intracranial abnormality. Normal vascular imaging of the head. Electronically Signed   By: Guadlupe Spanish M.D.   On: 05/18/2019 22:21    MAU Course  Procedures  MDM -preeclampsia evaluation with multiple severe range BP in [facility] with hydralazine given per Dr. Vergie Living prior to transport -symptoms include: none -UA: hazy/50GLU/mod hgb/sm leuks/rare bacteria -CBC: H/H 11.0/34.3, platelets 493 -CMP: serum creatinine 0.98, AST/ALT 18/16 -consulted with Dr. Vergie Living, change patient's Norvasc dose to 10mg  daily with first dose in MAU tonight and BP recheck at [facility] on Monday 06/10/2019, patient should also check BP once daily about 1-2 hours after taking BP meds and call RN line if BP elevated -Norvasc given -pt discharged to home in stable condition  Orders Placed This Encounter  Procedures  . Comprehensive metabolic panel    Standing Status:   Standing    Number of Occurrences:   1  . Ethanol    Standing Status:   Standing    Number of Occurrences:   1  . Lipase, blood    Standing Status:   Standing    Number of Occurrences:   1  . CBC with Differential    Standing Status:   Standing    Number of Occurrences:   1  . Magnesium    Standing Status:   Standing    Number of Occurrences:   1  . Urinalysis, Routine w reflex microscopic    Standing Status:   Standing  Number of Occurrences:   1  . Vital signs    Standing Status:   Standing    Number of Occurrences:   1  . Discharge patient    Order Specific Question:   Discharge disposition    Answer:   01-Home or Self Care [1]    Order Specific Question:   Discharge patient date    Answer:   06/06/2019   Meds ordered this encounter  Medications  . hydrALAZINE (APRESOLINE) injection 10 mg  . amLODipine (NORVASC) tablet 10 mg  . amLODipine (NORVASC) 10 MG tablet    Sig: Take 1 tablet (10 mg total) by mouth daily.    Dispense:  30 tablet     Refill:  1    Order Specific Question:   Supervising Provider    Answer:   Reva Bores [[id number]]    Assessment and Plan   1. Hypertension, unspecified type   2. Vaginal laceration, old    Allergies as of 06/06/2019   No Known Allergies     Medication List    TAKE these medications   acetaminophen 325 MG tablet Commonly known as: Tylenol Take 2 tablets (650 mg total) by mouth every 6 (six) hours as needed (for pain scale < 4).   albuterol 108 (90 Base) MCG/ACT inhaler Commonly known as: VENTOLIN HFA Inhale 1-2 puffs into the lungs every 6 (six) hours as needed for wheezing or shortness of breath.   amLODipine 10 MG tablet Commonly known as: NORVASC Take 1 tablet (10 mg total) by mouth daily. What changed:   medication strength  how much to take   butalbital-acetaminophen-caffeine 50-325-40 MG tablet Commonly known as: FIORICET Take 1-2 tablets by mouth every 6 (six) hours as needed for headache.   cyclobenzaprine 10 MG tablet Commonly known as: FLEXERIL Take 1 tablet (10 mg total) by mouth every 8 (eight) hours as needed (for headaches).   ferrous sulfate 325 (65 FE) MG tablet Take 1 tablet (325 mg total) by mouth every other day.   ibuprofen 600 MG tablet Commonly known as: ADVIL Take 1 tablet (600 mg total) by mouth every 6 (six) hours.   norethindrone 0.35 MG tablet Commonly known as: MICRONOR Take 1 tablet (0.35 mg total) by mouth daily.   Prenatal Gummies/DHA & FA 0.4-32.5 MG Chew Chew 1 each by mouth daily.       -RX Norvasc 10mg  -[facility] Monday appt, message sent to clinic to schedule, patient advised to take BP meds prior to appointment -patient to monitor BP daily, 1-2 hours after taking medication and call nurse line if elevated at 140/90 or above -strict preeclampsia/return MAU precautions given -pt discharged to home in stable condition  Wednesday E Syvilla Martin 06/07/2019, 1:21 PM

## 2019-06-10 ENCOUNTER — Ambulatory Visit: Payer: BLUE CROSS/BLUE SHIELD

## 2019-09-26 ENCOUNTER — Encounter (HOSPITAL_COMMUNITY): Payer: Self-pay | Admitting: Emergency Medicine

## 2019-09-26 ENCOUNTER — Emergency Department (HOSPITAL_COMMUNITY)
Admission: EM | Admit: 2019-09-26 | Discharge: 2019-09-26 | Disposition: A | Discharge status: Home / Self Care | Payer: Medicaid Other | Attending: Emergency Medicine | Admitting: Emergency Medicine

## 2019-09-26 ENCOUNTER — Emergency Department (HOSPITAL_COMMUNITY): Payer: Medicaid Other

## 2019-09-26 ENCOUNTER — Telehealth: Payer: Self-pay | Admitting: Nurse Practitioner

## 2019-09-26 ENCOUNTER — Other Ambulatory Visit: Payer: Self-pay

## 2019-09-26 DIAGNOSIS — U071 COVID-19: Secondary | ICD-10-CM | POA: Diagnosis not present

## 2019-09-26 DIAGNOSIS — R05 Cough: Secondary | ICD-10-CM | POA: Diagnosis present

## 2019-09-26 DIAGNOSIS — J45901 Unspecified asthma with (acute) exacerbation: Secondary | ICD-10-CM | POA: Insufficient documentation

## 2019-09-26 DIAGNOSIS — Z79899 Other long term (current) drug therapy: Secondary | ICD-10-CM | POA: Diagnosis not present

## 2019-09-26 DIAGNOSIS — R0602 Shortness of breath: Secondary | ICD-10-CM | POA: Diagnosis not present

## 2019-09-26 DIAGNOSIS — R000 Tachycardia, unspecified: Secondary | ICD-10-CM | POA: Diagnosis not present

## 2019-09-26 LAB — BASIC METABOLIC PANEL
Anion gap: 9 (ref 5–15)
BUN: 10 mg/dL (ref 6–20)
CO2: 25 mmol/L (ref 22–32)
Calcium: 8.6 mg/dL — ABNORMAL LOW (ref 8.9–10.3)
Chloride: 104 mmol/L (ref 98–111)
Creatinine, Ser: 1.06 mg/dL — ABNORMAL HIGH (ref 0.44–1.00)
GFR calc Af Amer: >60 mL/min (ref >60)
GFR calc non Af Amer: >60 mL/min (ref >60)
Glucose, Bld: 104 mg/dL — ABNORMAL HIGH (ref 70–99)
Potassium: 4.2 mmol/L (ref 3.5–5.1)
Sodium: 138 mmol/L (ref 135–145)

## 2019-09-26 LAB — CBC
HCT: 36.2 % (ref 36.0–46.0)
Hemoglobin: 11.9 g/dL — ABNORMAL LOW (ref 12.0–15.0)
MCH: 23.6 pg — ABNORMAL LOW (ref 26.0–34.0)
MCHC: 32.9 g/dL (ref 30.0–36.0)
MCV: 71.8 fL — ABNORMAL LOW (ref 80.0–100.0)
Platelets: 269 10*3/uL (ref 150–400)
RBC: 5.04 MIL/uL (ref 3.87–5.11)
RDW: 18.6 % — ABNORMAL HIGH (ref 11.5–15.5)
WBC: 4.9 10*3/uL (ref 4.0–10.5)
nRBC: 0 % (ref 0.0–0.2)

## 2019-09-26 LAB — I-STAT BETA HCG BLOOD, ED (MC, WL, AP ONLY): I-stat hCG, quantitative: <5 m[IU]/mL (ref <5)

## 2019-09-26 LAB — SARS CORONAVIRUS 2 BY RT PCR (HOSPITAL ORDER, PERFORMED IN ~~LOC~~ HOSPITAL LAB): SARS Coronavirus 2: POSITIVE — AB

## 2019-09-26 LAB — TROPONIN I (HIGH SENSITIVITY)
Troponin I (High Sensitivity): 2 ng/L (ref <18)
Troponin I (High Sensitivity): 3 ng/L (ref <18)

## 2019-09-26 MED ORDER — ALBUTEROL SULFATE HFA 108 (90 BASE) MCG/ACT IN AERS
4.0000 | INHALATION_SPRAY | Freq: Once | RESPIRATORY_TRACT | Status: AC
  Administered 2019-09-26: 4 via RESPIRATORY_TRACT
  Filled 2019-09-26: qty 6.7

## 2019-09-26 MED ORDER — ACETAMINOPHEN 325 MG PO TABS
650.0000 mg | ORAL_TABLET | Freq: Once | ORAL | Status: AC
  Administered 2019-09-26: 650 mg via ORAL
  Filled 2019-09-26: qty 2

## 2019-09-26 MED ORDER — GUAIFENESIN-DM 100-10 MG/5ML PO SYRP
5.0000 mL | ORAL_SOLUTION | ORAL | Status: DC | PRN
  Administered 2019-09-26: 5 mL via ORAL
  Filled 2019-09-26: qty 10

## 2019-09-26 NOTE — ED Provider Notes (Signed)
Patient was received at shift change from Dr. Mechele Collin Nguyen's please see his note for further detail.  In short patient presents with 7 days of cough, general weakness, fever decreased appetite.  Labs were performed and showed slight AKI, with microcytic anemia which appears to be at baseline for patient.  Covid testing pending at this time.  Chest x-ray was performed shows right basilar and left midlung airspace disease consistent with pneumonia possible atypical/viral infection.  No new oxygen requirements.Patient was given albuterol, Tylenol, Robitussin Physical Exam  BP 107/64   Pulse 96   Temp (!) 102.9 F (39.4 C) (Oral)   Resp (!) 31   LMP 09/21/2019 (Exact Date)   SpO2 95%   Physical Exam Vitals and nursing note reviewed.  Constitutional:      General: She is not in acute distress.    Appearance: Normal appearance. She is not ill-appearing or diaphoretic.  HENT:     Head: Normocephalic and atraumatic.     Nose: No congestion or rhinorrhea.  Eyes:     General: No scleral icterus.       Right eye: No discharge.        Left eye: No discharge.     Conjunctiva/sclera: Conjunctivae normal.  Pulmonary:     Effort: Pulmonary effort is normal. No respiratory distress.     Breath sounds: Normal breath sounds. No stridor. No wheezing, rhonchi or rales.  Chest:     Chest wall: No tenderness.  Musculoskeletal:     Cervical back: Neck supple.     Right lower leg: No edema.     Left lower leg: No edema.  Skin:    General: Skin is warm and dry.     Capillary Refill: Capillary refill takes less than 2 seconds.     Coloration: Skin is not jaundiced or pale.  Neurological:     Mental Status: She is alert and oriented to person, place, and time.  Psychiatric:        Mood and Affect: Mood normal.     ED Course/Procedures   Clinical Course as of Sep 25 1852  Thu Sep 26, 2019  1604 Consistent with viral pneumonia, no edema, interpreted by me  DG Chest 2 View [EW]  1604 Normal   I-Stat beta hCG blood, ED [EW]  1604 Normal except hemoglobin slightly low  CBC(!) [EW]    Clinical Course User Index [EW] Mancel Bale, MD    Procedures  MDM   On examination patient was alert and oriented, did not appear to be in acute distress.  Lung sounds were clear bilaterally, no signs of impending respiratory failure, no nasal flaring or retractions noted.  She was able to speak in full sentences without difficulty.  Covid test is positive contacted infusion clinic and they should contact her if she meets criteria for infusion.  I have low suspicion that patient requires hospital admission to treat her for Covid as she has no new oxygen requirements, no signs of acute respiratory failure seen on exam and has little comorbidities.  Patient appears to be resting calmly in bed showing no acute signs stress.  Vital signs remained stable does not meet criteria to be admitted to the hospital.  Patient is suffering from infection from COVID-19 recommend over-the-counter pain medication for fever and pain control.  Provide her information for post Covid care.  Contacted infusion clinic and they will reach out to her if she meets criteria for infusion.  Patient was given at home care  as well as strict return precautions.  Patient verbalized that she understood and agreed with said plan.     Carroll Sage, PA-C 09/26/19 Herbie Baltimore    Mancel Bale, MD 09/26/19 2009

## 2019-09-26 NOTE — Telephone Encounter (Signed)
Called to discuss with Brianna Bender about Covid symptoms and the use of casirivimab/imdevimab, a combination monoclonal antibody infusion for those with mild to moderate Covid symptoms and at a high risk of hospitalization.     Pt is qualified for this infusion at the [facility] infusion center due to co-morbid conditions and/or a member of an at-risk group.  Unable to reach patient. Voicemail left with My Chart message sent.   Patient Active Problem List   Diagnosis Date Noted  . Anemia 05/11/2019  . Gestational hypertension 05/09/2019  . Asthma affecting pregnancy in third trimester 04/17/2019  . Frequent headaches 04/03/2019  . Sickle cell trait (HCC) 02/25/2019  . Late prenatal care affecting pregnancy in second trimester 02/24/2019  . Supervision of low-risk pregnancy 02/12/2019  . 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 Suicidal ideation 11/18/2016  . Asthma exacerbation 05/06/2013    Willette Alma, AGPCNP-BC

## 2019-09-26 NOTE — ED Triage Notes (Signed)
Pt reports c/o difficulty breathing and chest pain. Pt states this started last Thursday and was unrelieved with Robitussin and Tylenol.

## 2019-09-26 NOTE — ED Notes (Signed)
Date and time results received: 09/26/19 1800 (use smartphrase ".now" to insert current time)  Test: Covid  Critical Value: Positive  Name of Provider Notified: Dr Effie Shy   Orders Received? Or Actions Taken?:

## 2019-09-26 NOTE — Discharge Instructions (Signed)
You have been seen here for fever and chills.  Lab work and imaging consistent with COVID-19.  I have contacted the infusion clinic as you may qualify for infusion to treat COVID-19.  They will call you with the next 2 days for possible treatment.  I have also given you information for post Covid care please contact.   In the meantime I want you to continue to self quarantine for next 14 days, you can take over-the-counter pain medications like ibuprofen or Tylenol for fever and pain control. please follow dosing on the back of bottle.  I want you to stay hydrated and eat plenty of foods.  I want to come back to emergency department if you develop worsening shortness of breath, chest pain, uncontrolled abdominal pain, nausea, vomiting, diarrhea as these symptoms require further evaluation management.

## 2019-09-26 NOTE — ED Provider Notes (Signed)
[facility]-EMERGENCY DEPT Provider Note   CSN: [id number] Arrival date & time: 09/26/19  1400     History Chief Complaint  Patient presents with  . Chest Pain  . Difficulty breathing    Brianna Bender is a 19 y.o. female.  HPI She has been ill for 7 days with cough, general weakness, fever and decreased appetite.  She has not had Covid vaccines.  She denies focal weakness or paresthesia.  There are no other known modifying factors.    Past Medical History:  Diagnosis Date  . Asthma   . Sickle cell trait (HCC) 02/25/2019   QTrimester urine culture - 2nd trimester negative.    [ ]  3rd tri Given genetic counselor info to discuss FOB testing    Patient Active Problem List   Diagnosis Date Noted  . Anemia 05/11/2019  . Gestational hypertension 05/09/2019  . Asthma affecting pregnancy in third trimester 04/17/2019  . Frequent headaches 04/03/2019  . Sickle cell trait (HCC) 02/25/2019  . Late prenatal care affecting pregnancy in second trimester 02/24/2019  . Supervision of low-risk pregnancy 02/12/2019  . 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 Suicidal ideation 11/18/2016  . Asthma exacerbation 05/06/2013    Past Surgical History:  Procedure Laterality Date  . WISDOM TOOTH EXTRACTION       OB History    Gravida  2   Para  1   Term  1   Preterm  0   AB  1   Living  1     SAB  0   TAB  1   Ectopic  0   Multiple  0   Live Births  1           Family History  Problem Relation Age of Onset  . Healthy Mother   . Healthy Father     Social History   Tobacco Use  . Smoking status: Never Smoker  . Smokeless tobacco: Never Used  Vaping Use  . Vaping Use: Never used  Substance Use Topics  . Alcohol use: Not Currently  . Drug use: Not Currently    Types: Marijuana    Home Medications Prior to Admission medications   Medication Sig Start Date End Date Taking? Authorizing  Provider  acetaminophen (TYLENOL) 325 MG tablet Take 2 tablets (650 mg total) by mouth every 6 (six) hours as needed (for pain scale < 4). 05/12/19   Fair, Hoyle Sauerhelsea N, MD  albuterol (VENTOLIN HFA) 108 (90 Base) MCG/ACT inhaler Inhale 1-2 puffs into the lungs every 6 (six) hours as needed for wheezing or shortness of breath. 04/17/19   [place] BingPickens, Charlie, MD  amLODipine (NORVASC) 10 MG tablet Take 1 tablet (10 mg total) by mouth daily. 06/06/19   Nugent, Odie SeraNicole E, NP  butalbital-acetaminophen-caffeine (FIORICET) 50-325-40 MG tablet Take 1-2 tablets by mouth every 6 (six) hours as needed for headache. 05/19/19 05/18/20  Derwood KaplanNanavati, Ankit, MD  cyclobenzaprine (FLEXERIL) 10 MG tablet Take 1 tablet (10 mg total) by mouth every 8 (eight) hours as needed (for headaches). Patient not taking: Reported on 05/18/2019 04/03/19   Rasch, Victorino DikeJennifer I, NP  ferrous sulfate 325 (65 FE) MG tablet Take 1 tablet (325 mg total) by mouth every other day. Patient not taking: Reported on 06/06/2019 05/12/19   Joselyn ArrowFair, Chelsea N, MD  ibuprofen (ADVIL) 600 MG tablet Take 1 tablet (600 mg total) by mouth every 6 (six) hours. Patient not taking: Reported on 06/06/2019 05/12/19  Fair, Hoyle Sauer, MD  norethindrone (MICRONOR) 0.35 MG tablet Take 1 tablet (0.35 mg total) by mouth daily. 05/12/19   Fair, Hoyle Sauer, MD  Prenatal MV-Min-FA-Omega-3 (PRENATAL GUMMIES/DHA & FA) 0.4-32.5 MG CHEW Chew 1 each by mouth daily. 04/03/19   Rasch, Harolyn Rutherford, NP    Allergies    Patient has no known allergies.  Review of Systems   Review of Systems  All other systems reviewed and are negative.   Physical Exam Updated Vital Signs BP (!) 101/56   Pulse 90   Temp 100 F (37.8 C) (Oral)   Resp (!) 27   LMP 09/21/2019 (Exact Date)   SpO2 97%   Physical Exam Vitals and nursing note reviewed.  Constitutional:      General: She is not in acute distress.    Appearance: She is well-developed. She is obese. She is not ill-appearing, toxic-appearing or  diaphoretic.  HENT:     Head: Normocephalic and atraumatic.     Right Ear: External ear normal.     Left Ear: External ear normal.  Eyes:     Conjunctiva/sclera: Conjunctivae normal.     Pupils: Pupils are equal, round, and reactive to light.  Neck:     Trachea: Phonation normal.  Cardiovascular:     Rate and Rhythm: Normal rate and regular rhythm.  Pulmonary:     Effort: Pulmonary effort is normal. No respiratory distress.     Breath sounds: No stridor.  Abdominal:     General: There is no distension.     Palpations: Abdomen is soft.     Tenderness: There is no abdominal tenderness.  Musculoskeletal:        General: No swelling or tenderness. Normal range of motion.     Cervical back: Normal range of motion and neck supple.     Right lower leg: No edema.     Left lower leg: No edema.  Skin:    General: Skin is warm and dry.  Neurological:     Mental Status: She is alert and oriented to person, place, and time.     Cranial Nerves: No cranial nerve deficit.     Sensory: No sensory deficit.     Motor: No abnormal muscle tone.     Coordination: Coordination normal.  Psychiatric:        Mood and Affect: Mood normal.        Behavior: Behavior normal.        Thought Content: Thought content normal.        Judgment: Judgment normal.     ED Results / Procedures / Treatments   Labs (all labs ordered are listed, but only abnormal results are displayed) Labs Reviewed  SARS CORONAVIRUS 2 BY RT PCR (HOSPITAL ORDER, PERFORMED IN [facility]) - Abnormal; Notable for the following components:      Result Value   SARS Coronavirus 2 POSITIVE (*)    All other components within normal limits  BASIC METABOLIC PANEL - Abnormal; Notable for the following components:   Glucose, Bld 104 (*)    Creatinine, Ser 1.06 (*)    Calcium 8.6 (*)    All other components within normal limits  CBC - Abnormal; Notable for the following components:   Hemoglobin 11.9 (*)    MCV 71.8 (*)      MCH 23.6 (*)    RDW 18.6 (*)    All other components within normal limits  I-STAT BETA HCG BLOOD, ED ([facility], [facility], [facility]  ONLY)  TROPONIN I (HIGH SENSITIVITY)  TROPONIN I (HIGH SENSITIVITY)    EKG EKG Interpretation  Date/Time:  Thursday September 26 2019 14:27:17 EDT Ventricular Rate:  105 PR Interval:    QRS Duration: 72 QT Interval:  309 QTC Calculation: 409 R Axis:   58 Text Interpretation: Sinus tachycardia 12 Lead; [place] No previous ECGs available Confirmed by Mancel Bale [phone]) on 09/26/2019 3:52:31 PM   Radiology DG Chest 2 View  Result Date: 09/26/2019 CLINICAL DATA:  Shortness of breath for 1 week. History of sickle cell disease. EXAM: CHEST - 2 VIEW COMPARISON:  PA and lateral chest 11/08/2015. FINDINGS: The patient has right basilar and left mid lung airspace disease consistent with pneumonia. No pneumothorax or pleural effusion. Heart size is normal. No acute or focal bony abnormality. IMPRESSION: Right basilar and left midlung airspace disease consistent with pneumonia. Atypical/viral infection is possible. Electronically Signed   By: Drusilla Kanner M.D.   On: 09/26/2019 15:08    Procedures Procedures (including critical care time)  Medications Ordered in ED Medications  guaiFENesin-dextromethorphan (ROBITUSSIN DM) 100-10 MG/5ML syrup 5 mL (5 mLs Oral Given 09/26/19 1632)  acetaminophen (TYLENOL) tablet 650 mg (650 mg Oral Given 09/26/19 1631)  albuterol (VENTOLIN HFA) 108 (90 Base) MCG/ACT inhaler 4 puff (4 puffs Inhalation Given 09/26/19 1632)    ED Course  I have reviewed the triage vital signs and the nursing notes.  Pertinent labs & imaging results that were available during my care of the patient were reviewed by me and considered in my medical decision making (see chart for details).  Clinical Course as of Sep 26 2007  Thu Sep 26, 2019  1604 Consistent with viral pneumonia, no edema, interpreted by me  DG Chest 2 View [EW]  1604 Normal  I-Stat beta  hCG blood, ED [EW]  1604 Normal except hemoglobin slightly low  CBC(!) [EW]    Clinical Course User Index [EW] Mancel Bale, MD   MDM Rules/Calculators/A&P                           Patient Vitals for the past 24 hrs:  BP Temp Temp src Pulse Resp SpO2  09/26/19 1856 (!) 101/56 100 F (37.8 C) Oral 90 (!) 27 97 %  09/26/19 1800 107/64 -- -- 96 (!) 31 95 %  09/26/19 1641 126/84 -- -- 89 (!) 23 95 %  09/26/19 1510 (!) 119/95 (!) 102.9 F (39.4 C) Oral 97 (!) 22 97 %  09/26/19 1500 (!) 119/95 -- -- 91 -- 96 %  09/26/19 1428 124/72 -- -- -- -- --  09/26/19 1424 -- (!) 102.9 F (39.4 C) Oral (!) 105 (!) 22 91 %      Medical Decision Making:  This patient is presenting for evaluation of respiratory illness, which does require a range of treatment options, and is a complaint that involves a high risk of morbidity and mortality. The differential diagnoses include Covid infection, pneumonia, bronchitis. I decided to review old records, and in summary young healthy female with illness for 1 week, concerning for Covid infection.  I did not require additional historical information from anyone.  Clinical Laboratory Tests Ordered, included CBC, Metabolic panel and Pregnancy test, Covid test. Review indicates Covid infection is present.  No evidence for bacterial infection or metabolic instability.. Radiologic Tests Ordered, included chest x-ray.  I independently Visualized: Radiographic images, which show findings consistent with viral pneumonia  Cardiac Monitor Tracing which shows  normal sinus rhythm     Critical Interventions-clinical evaluation, laboratory testing, chest x-ray, observation reassessment  After These Interventions, the Patient was reevaluated and was found stable for discharge.  CRITICAL CARE-no Performed by: Mancel Bale  Nursing Notes Reviewed/ Care Coordinated Applicable Imaging Reviewed Interpretation of Laboratory Data incorporated into ED  treatment   Disposition per PA following return of Covid testing.    Final Clinical Impression(s) / ED Diagnoses Final diagnoses:  COVID-19 virus infection    Rx / DC Orders ED Discharge Orders    None       Mancel Bale, MD 09/26/19 2009

## 2019-09-30 ENCOUNTER — Inpatient Hospital Stay (HOSPITAL_COMMUNITY)
Admission: EM | Admit: 2019-09-30 | Discharge: 2019-10-04 | DRG: 177 | Disposition: A | Discharge status: Home / Self Care | Payer: Medicaid Other | Attending: Family Medicine | Admitting: Family Medicine

## 2019-09-30 ENCOUNTER — Emergency Department (HOSPITAL_COMMUNITY): Payer: Medicaid Other

## 2019-09-30 ENCOUNTER — Encounter (HOSPITAL_COMMUNITY): Payer: Self-pay | Admitting: Emergency Medicine

## 2019-09-30 ENCOUNTER — Other Ambulatory Visit: Payer: Self-pay

## 2019-09-30 DIAGNOSIS — R7989 Other specified abnormal findings of blood chemistry: Secondary | ICD-10-CM | POA: Diagnosis not present

## 2019-09-30 DIAGNOSIS — J45901 Unspecified asthma with (acute) exacerbation: Secondary | ICD-10-CM | POA: Diagnosis present

## 2019-09-30 DIAGNOSIS — R0902 Hypoxemia: Secondary | ICD-10-CM

## 2019-09-30 DIAGNOSIS — D571 Sickle-cell disease without crisis: Secondary | ICD-10-CM | POA: Diagnosis present

## 2019-09-30 DIAGNOSIS — J1282 Pneumonia due to coronavirus disease 2019: Secondary | ICD-10-CM | POA: Diagnosis not present

## 2019-09-30 DIAGNOSIS — F419 Anxiety disorder, unspecified: Secondary | ICD-10-CM | POA: Diagnosis present

## 2019-09-30 DIAGNOSIS — U071 COVID-19: Secondary | ICD-10-CM | POA: Diagnosis not present

## 2019-09-30 DIAGNOSIS — Z79899 Other long term (current) drug therapy: Secondary | ICD-10-CM | POA: Diagnosis not present

## 2019-09-30 DIAGNOSIS — Z6841 Body Mass Index (BMI) 40.0 and over, adult: Secondary | ICD-10-CM | POA: Diagnosis not present

## 2019-09-30 DIAGNOSIS — I2699 Other pulmonary embolism without acute cor pulmonale: Secondary | ICD-10-CM | POA: Diagnosis not present

## 2019-09-30 DIAGNOSIS — R000 Tachycardia, unspecified: Secondary | ICD-10-CM | POA: Diagnosis not present

## 2019-09-30 DIAGNOSIS — J9601 Acute respiratory failure with hypoxia: Secondary | ICD-10-CM | POA: Diagnosis not present

## 2019-09-30 DIAGNOSIS — R0600 Dyspnea, unspecified: Secondary | ICD-10-CM | POA: Diagnosis not present

## 2019-09-30 DIAGNOSIS — R079 Chest pain, unspecified: Secondary | ICD-10-CM | POA: Diagnosis not present

## 2019-09-30 LAB — CBC WITH DIFFERENTIAL/PLATELET
Abs Immature Granulocytes: 0.07 10*3/uL (ref 0.00–0.07)
Basophils Absolute: 0 10*3/uL (ref 0.0–0.1)
Basophils Relative: 1 %
Eosinophils Absolute: 0 10*3/uL (ref 0.0–0.5)
Eosinophils Relative: 1 %
HCT: 38.7 % (ref 36.0–46.0)
Hemoglobin: 12.5 g/dL (ref 12.0–15.0)
Immature Granulocytes: 1 %
Lymphocytes Relative: 18 %
Lymphs Abs: 1.1 10*3/uL (ref 0.7–4.0)
MCH: 23.7 pg — ABNORMAL LOW (ref 26.0–34.0)
MCHC: 32.3 g/dL (ref 30.0–36.0)
MCV: 73.3 fL — ABNORMAL LOW (ref 80.0–100.0)
Monocytes Absolute: 0.4 10*3/uL (ref 0.1–1.0)
Monocytes Relative: 7 %
Neutro Abs: 4.6 10*3/uL (ref 1.7–7.7)
Neutrophils Relative %: 72 %
Platelets: 546 10*3/uL — ABNORMAL HIGH (ref 150–400)
RBC: 5.28 MIL/uL — ABNORMAL HIGH (ref 3.87–5.11)
RDW: 19 % — ABNORMAL HIGH (ref 11.5–15.5)
WBC: 6.3 10*3/uL (ref 4.0–10.5)
nRBC: 0 % (ref 0.0–0.2)

## 2019-09-30 LAB — TROPONIN I (HIGH SENSITIVITY)
Troponin I (High Sensitivity): <2 ng/L (ref <18)
Troponin I (High Sensitivity): 2 ng/L (ref <18)

## 2019-09-30 LAB — FERRITIN: Ferritin: 175 ng/mL (ref 11–307)

## 2019-09-30 LAB — C-REACTIVE PROTEIN: CRP: 9.4 mg/dL — ABNORMAL HIGH (ref <1.0)

## 2019-09-30 LAB — COMPREHENSIVE METABOLIC PANEL
ALT: 37 U/L (ref 0–44)
AST: 37 U/L (ref 15–41)
Albumin: 3.4 g/dL — ABNORMAL LOW (ref 3.5–5.0)
Alkaline Phosphatase: 46 U/L (ref 38–126)
Anion gap: 13 (ref 5–15)
BUN: 13 mg/dL (ref 6–20)
CO2: 26 mmol/L (ref 22–32)
Calcium: 9.1 mg/dL (ref 8.9–10.3)
Chloride: 104 mmol/L (ref 98–111)
Creatinine, Ser: 0.94 mg/dL (ref 0.44–1.00)
GFR calc Af Amer: >60 mL/min (ref >60)
GFR calc non Af Amer: >60 mL/min (ref >60)
Glucose, Bld: 95 mg/dL (ref 70–99)
Potassium: 4.2 mmol/L (ref 3.5–5.1)
Sodium: 143 mmol/L (ref 135–145)
Total Bilirubin: 0.7 mg/dL (ref 0.3–1.2)
Total Protein: 8.1 g/dL (ref 6.5–8.1)

## 2019-09-30 LAB — PROCALCITONIN: Procalcitonin: <0.1 ng/mL

## 2019-09-30 LAB — LACTIC ACID, PLASMA
Lactic Acid, Venous: 1.8 mmol/L (ref 0.5–1.9)
Lactic Acid, Venous: 1.7 mmol/L (ref 0.5–1.9)

## 2019-09-30 LAB — TRIGLYCERIDES: Triglycerides: 211 mg/dL — ABNORMAL HIGH (ref <150)

## 2019-09-30 LAB — D-DIMER, QUANTITATIVE: D-Dimer, Quant: 4.59 ug/mL-FEU — ABNORMAL HIGH (ref 0.00–0.50)

## 2019-09-30 LAB — FIBRINOGEN: Fibrinogen: 736 mg/dL — ABNORMAL HIGH (ref 210–475)

## 2019-09-30 LAB — I-STAT BETA HCG BLOOD, ED (MC, WL, AP ONLY): I-stat hCG, quantitative: <5 m[IU]/mL (ref <5)

## 2019-09-30 LAB — LACTATE DEHYDROGENASE: LDH: 346 U/L — ABNORMAL HIGH (ref 98–192)

## 2019-09-30 MED ORDER — SODIUM CHLORIDE 0.9 % IV SOLN
200.0000 mg | Freq: Once | INTRAVENOUS | Status: AC
  Administered 2019-09-30: 200 mg via INTRAVENOUS
  Filled 2019-09-30: qty 200

## 2019-09-30 MED ORDER — ONDANSETRON HCL 4 MG PO TABS: 4.0000 mg | ORAL_TABLET | Freq: Four times a day (QID) | ORAL | Status: DC | PRN

## 2019-09-30 MED ORDER — ENOXAPARIN SODIUM 40 MG/0.4ML ~~LOC~~ SOLN
40.0000 mg | SUBCUTANEOUS | Status: DC
  Filled 2019-09-30: qty 0.4

## 2019-09-30 MED ORDER — ACETAMINOPHEN 325 MG PO TABS
650.0000 mg | ORAL_TABLET | Freq: Four times a day (QID) | ORAL | Status: DC | PRN
  Administered 2019-09-30 – 2019-10-04 (×2): 650 mg via ORAL
  Filled 2019-09-30 (×2): qty 2

## 2019-09-30 MED ORDER — HYDROCOD POLST-CPM POLST ER 10-8 MG/5ML PO SUER
5.0000 mL | Freq: Two times a day (BID) | ORAL | Status: DC | PRN
  Administered 2019-10-04: 5 mL via ORAL
  Filled 2019-09-30: qty 5

## 2019-09-30 MED ORDER — GUAIFENESIN-DM 100-10 MG/5ML PO SYRP
10.0000 mL | ORAL_SOLUTION | ORAL | Status: DC | PRN
  Administered 2019-09-30 – 2019-10-04 (×3): 10 mL via ORAL
  Filled 2019-09-30 (×3): qty 10

## 2019-09-30 MED ORDER — ALBUTEROL SULFATE HFA 108 (90 BASE) MCG/ACT IN AERS: 1.0000 | INHALATION_SPRAY | Freq: Four times a day (QID) | RESPIRATORY_TRACT | Status: DC | PRN

## 2019-09-30 MED ORDER — IOHEXOL 350 MG/ML SOLN
100.0000 mL | Freq: Once | INTRAVENOUS | Status: AC | PRN
  Administered 2019-09-30: 100 mL via INTRAVENOUS

## 2019-09-30 MED ORDER — DEXAMETHASONE SODIUM PHOSPHATE 10 MG/ML IJ SOLN
10.0000 mg | Freq: Once | INTRAMUSCULAR | Status: AC
  Administered 2019-09-30: 10 mg via INTRAVENOUS
  Filled 2019-09-30: qty 1

## 2019-09-30 MED ORDER — ONDANSETRON HCL 4 MG/2ML IJ SOLN: 4.0000 mg | Freq: Four times a day (QID) | INTRAMUSCULAR | Status: DC | PRN

## 2019-09-30 MED ORDER — DEXAMETHASONE 4 MG PO TABS
6.0000 mg | ORAL_TABLET | ORAL | Status: DC
  Administered 2019-09-30: 6 mg via ORAL
  Filled 2019-09-30: qty 1

## 2019-09-30 MED ORDER — SODIUM CHLORIDE 0.9 % IV SOLN
100.0000 mg | Freq: Every day | INTRAVENOUS | Status: AC
  Administered 2019-10-01 – 2019-10-04 (×4): 100 mg via INTRAVENOUS
  Filled 2019-09-30 (×5): qty 20

## 2019-09-30 MED ORDER — SODIUM CHLORIDE (PF) 0.9 % IJ SOLN
INTRAMUSCULAR | Status: AC
  Filled 2019-09-30: qty 50

## 2019-09-30 MED ORDER — ALBUTEROL SULFATE HFA 108 (90 BASE) MCG/ACT IN AERS
6.0000 | INHALATION_SPRAY | Freq: Once | RESPIRATORY_TRACT | Status: AC
  Administered 2019-09-30: 6 via RESPIRATORY_TRACT
  Filled 2019-09-30: qty 6.7

## 2019-09-30 NOTE — ED Notes (Signed)
Pt O2 noted to be 88% on RA, good waveform. Pt put on 2L O2 via [place], O2 sats improved to 96%.  Will continue to monitor.

## 2019-09-30 NOTE — H&P (Signed)
History and Physical    Brianna Bender ZOX:[id number] DOB: [birth year] DOA: 09/30/2019  PCP: Patient, No Pcp Per  Patient coming from: Home  I have personally briefly reviewed patient's old medical records in [facility]  Chief Complaint: SOB  HPI: Brianna Bender is a 19 y.o. female with medical history significant of sickle cell trait, asthma.  Pt with 11 day h/o COVID symptoms.  Now has CP and SOB.  Didn't get COVID vaccine.  Tested COVID positive on 8/12.  Developed CP and SOB since then.  Was called on 8/12 for possible MAB treatment according NP telephone note, but they wernt able to get a-hold of pt and left voicemail instead.   ED Course: New O2 requirement, satting as low as 80% on RA, satting mid 90s at rest on 2L via [place].  CXR with the usual B PNA findings.  CRP 9.4.  D.Dimer 4.5, Procalcitonin neg and WBC nl.   Review of Systems: As per HPI, otherwise all review of systems negative.  Past Medical History:  Diagnosis Date  . Asthma   . Sickle cell trait (HCC) 02/25/2019   QTrimester urine culture - 2nd trimester negative.    [ ]  3rd tri Given genetic counselor info to discuss FOB testing    Past Surgical History:  Procedure Laterality Date  . WISDOM TOOTH EXTRACTION       reports that she has never smoked. She has never used smokeless tobacco. She reports previous alcohol use. She reports previous drug use. Drug: Marijuana.  No Known Allergies  Family History  Problem Relation Age of Onset  . Healthy Mother   . Healthy Father      Prior to Admission medications   Medication Sig Start Date End Date Taking? Authorizing Provider  acetaminophen (TYLENOL) 325 MG tablet Take 2 tablets (650 mg total) by mouth every 6 (six) hours as needed (for pain scale < 4). 05/12/19  Yes Fair, 05/14/19, MD  albuterol (VENTOLIN HFA) 108 (90 Base) MCG/ACT inhaler Inhale 1-2 puffs into the lungs every 6 (six) hours as needed for wheezing or shortness of breath. 04/17/19   Yes 06/17/19, MD  guaifenesin (ROBITUSSIN) 100 MG/5ML syrup Take 200 mg by mouth 3 (three) times daily as needed for cough.   Yes [provider]  amLODipine (NORVASC) 10 MG tablet Take 1 tablet (10 mg total) by mouth daily. Patient not taking: Reported on 09/30/2019 06/06/19   Nugent, 06/08/19, NP  butalbital-acetaminophen-caffeine (FIORICET) 50-325-40 MG tablet Take 1-2 tablets by mouth every 6 (six) hours as needed for headache. Patient not taking: Reported on 09/30/2019 05/19/19 05/18/20  07/18/20, MD  cyclobenzaprine (FLEXERIL) 10 MG tablet Take 1 tablet (10 mg total) by mouth every 8 (eight) hours as needed (for headaches). Patient not taking: Reported on 05/18/2019 04/03/19   Rasch, 04/05/19 I, NP  ferrous sulfate 325 (65 FE) MG tablet Take 1 tablet (325 mg total) by mouth every other day. Patient not taking: Reported on 06/06/2019 05/12/19   05/14/19, MD  ibuprofen (ADVIL) 600 MG tablet Take 1 tablet (600 mg total) by mouth every 6 (six) hours. Patient not taking: Reported on 06/06/2019 05/12/19   05/14/19, MD  norethindrone (MICRONOR) 0.35 MG tablet Take 1 tablet (0.35 mg total) by mouth daily. Patient not taking: Reported on 09/30/2019 05/12/19   05/14/19, MD  Prenatal MV-Min-FA-Omega-3 (PRENATAL GUMMIES/DHA & FA) 0.4-32.5 MG CHEW Chew 1 each by mouth daily. Patient not taking: Reported on  09/30/2019 04/03/19   Rasch, Victorino Dike I, NP    Physical Exam: Vitals:   09/30/19 1819 09/30/19 1824 09/30/19 1910 09/30/19 2133  BP: 112/82   124/71  Pulse: (!) 108   (!) 109  Resp: (!) 25   (!) 24  Temp: 98.9 F (37.2 C)   99 F (37.2 C)  TempSrc: Oral   Oral  SpO2: (!) 82% 95% 95% 95%    Constitutional: NAD, calm, comfortable Eyes: PERRL, lids and conjunctivae normal ENMT: Mucous membranes are moist. Posterior pharynx clear of any exudate or lesions.Normal dentition.  Neck: normal, supple, no masses, no thyromegaly Respiratory: clear to auscultation  bilaterally, no wheezing, no crackles. Normal respiratory effort. No accessory muscle use.  Cardiovascular: Regular rate and rhythm, no murmurs / rubs / gallops. No extremity edema. 2+ pedal pulses. No carotid bruits.  Abdomen: no tenderness, no masses palpated. No hepatosplenomegaly. Bowel sounds positive.  Musculoskeletal: no clubbing / cyanosis. No joint deformity upper and lower extremities. Good ROM, no contractures. Normal muscle tone.  Skin: no rashes, lesions, ulcers. No induration Neurologic: CN 2-12 grossly intact. Sensation intact, DTR normal. Strength 5/5 in all 4.  Psychiatric: Normal judgment and insight. Alert and oriented x 3. Normal mood.    Labs on Admission: I have personally reviewed following labs and imaging studies  CBC: Recent Labs  Lab 09/26/19 1527 09/30/19 1847  WBC 4.9 6.3  NEUTROABS  --  4.6  HGB 11.9* 12.5  HCT 36.2 38.7  MCV 71.8* 73.3*  PLT 269 546*   Basic Metabolic Panel: Recent Labs  Lab 09/26/19 1527 09/30/19 1847  NA 138 143  K 4.2 4.2  CL 104 104  CO2 25 26  GLUCOSE 104* 95  BUN 10 13  CREATININE 1.06* 0.94  CALCIUM 8.6* 9.1   GFR: CrCl cannot be calculated (Unknown ideal weight.). Liver Function Tests: Recent Labs  Lab 09/30/19 1847  AST 37  ALT 37  ALKPHOS 46  BILITOT 0.7  PROT 8.1  ALBUMIN 3.4*   No results for input(s): LIPASE, AMYLASE in the last 168 hours. No results for input(s): AMMONIA in the last 168 hours. Coagulation Profile: No results for input(s): INR, PROTIME in the last 168 hours. Cardiac Enzymes: No results for input(s): CKTOTAL, CKMB, CKMBINDEX, TROPONINI in the last 168 hours. BNP (last 3 results) No results for input(s): PROBNP in the last 8760 hours. HbA1C: No results for input(s): HGBA1C in the last 72 hours. CBG: No results for input(s): GLUCAP in the last 168 hours. Lipid Profile: Recent Labs    09/30/19 1847  TRIG 211*   Thyroid Function Tests: No results for input(s): TSH, T4TOTAL,  FREET4, T3FREE, THYROIDAB in the last 72 hours. Anemia Panel: Recent Labs    09/30/19 1847  FERRITIN 175   Urine analysis:    Component Value Date/Time   COLORURINE YELLOW 06/06/2019 2135   APPEARANCEUR HAZY (A) 06/06/2019 2135   LABSPEC 1.010 06/06/2019 2135   PHURINE 7.0 06/06/2019 2135   GLUCOSEU 50 (A) 06/06/2019 2135   HGBUR MODERATE (A) 06/06/2019 2135   BILIRUBINUR NEGATIVE 06/06/2019 2135   KETONESUR NEGATIVE 06/06/2019 2135   PROTEINUR NEGATIVE 06/06/2019 2135   NITRITE NEGATIVE 06/06/2019 2135   LEUKOCYTESUR SMALL (A) 06/06/2019 2135    Radiological Exams on Admission: DG Chest Port 1 View  Result Date: 09/30/2019 CLINICAL DATA:  COVID 19 positive, discharged 09/26/2019, worsening chest pain and dyspnea EXAM: PORTABLE CHEST 1 VIEW COMPARISON:  09/26/2019 chest radiograph. FINDINGS: Stable cardiomediastinal silhouette with normal  heart size. No pneumothorax. No pleural effusion. Extensive patchy opacities in the mid to lower lungs bilaterally, slightly worsened. IMPRESSION: Slight worsening of extensive patchy opacities in the mid to lower lungs bilaterally compatible with COVID-19 pneumonia. Electronically Signed   By: Delbert Phenix M.D.   On: 09/30/2019 19:00    EKG: Independently reviewed.  Assessment/Plan Active Problems:   Acute hypoxemic respiratory failure due to COVID-19 (HCC)    1. Acute hypoxic resp failure due to COVID-19 1. COVID pathway 2. Checking CTA to r/o PE given D.Dimer elevation 3. remdesivir 4. Decadron 5. Only 2L requirement so no Actemra just yet, use actemra if pt worsens. 6. Cont pulse ox and tele monitor 7. Daily labs  DVT prophylaxis: Lovenox per COVID pathway dosing for the moment, CTA chest pending Code Status: Full Family Communication: No family in room Disposition Plan: Home after off oxygen Consults called: None Admission status: Admit to inpatient  Severity of Illness: The appropriate patient status for this patient is  INPATIENT. Inpatient status is judged to be reasonable and necessary in order to provide the required intensity of service to ensure the patient's safety. The patient's presenting symptoms, physical exam findings, and initial radiographic and laboratory data in the context of their chronic comorbidities is felt to place them at high risk for further clinical deterioration. Furthermore, it is not anticipated that the patient will be medically stable for discharge from the hospital within 2 midnights of admission. The following factors support the patient status of inpatient.   IP status due to new O2 requirement associated with COVID-19 PNA.  * I certify that at the point of admission it is my clinical judgment that the patient will require inpatient hospital care spanning beyond 2 midnights from the point of admission due to high intensity of service, high risk for further deterioration and high frequency of surveillance required.*    Masaye Gatchalian M. DO [facility]  How to contact the [facility] Attending or Consulting provider 7A - 7P or covering provider during after hours 7P -7A, for this patient?  1. Check the care team in [facility] and look for a) attending/consulting TR[facility] provider listed and b) the [facility] team listed 2. Log into [email or web address]  Amion Physician Scheduling and messaging for groups and whole hospitals  On call and physician scheduling software for group practices, residents, hospitalists and other medical providers for call, clinic, rotation and shift schedules. On[employer] is a hospital-wide system for scheduling doctors and paging doctors on call. EasyPlot is for scientific plotting and data analysis.  [email or web address]  and use [place]'s universal password to access. If you do not have the password, please contact the hospital operator.  3. Locate the [facility] provider you are looking for under [facility] and page to a number that you can be directly reached. 4. If you still  have difficulty reaching the provider, please page the [facility] (Director on Call) for the Hospitalists listed on amion for assistance.  09/30/2019, 10:06 PM

## 2019-09-30 NOTE — ED Provider Notes (Signed)
[facility]-EMERGENCY DEPT Provider Note   CSN: [id number] Arrival date & time: 09/30/19  1759     History No chief complaint on file.   Brianna Bender is a 19 y.o. female with PMH significant for asthma, anemia, sickle cell trait, and positive COVID-19 testing 09/26/2019 who presents to the ED with complaints of shortness of breath.  Patient is approximately 11 days into her course of illness.  I first obtained history from triage she reports that she was 80% on RA with tachypnea and increased work of breathing.  On my examination, patient reports that approximately 4 to 5 days ago, when she first presented to the ER, she had developed her symptoms of acid being chest discomfort.  She states that since then it has been getting progressively worse.  She feels as though she cannot catch her breath in between sentences and her SOB is worse with any form of exertion.  She is describing her chest pain as a constant, central "squeezing".  Patient reports that she been trying to eat regular meals, but endorses diminished p.o. intake.  She has been checking her temperature at home and reports that it has been largely WNL.  She also denies any abdominal pain, history of clots, unilateral extremity edema or swelling, or other symptoms.  She does not have a primary care provider and has not used an inhaler in years.  Patient did not receive the COVID-19 vaccination.  HPI     Past Medical History:  Diagnosis Date  . Asthma   . Sickle cell trait (HCC) 02/25/2019   QTrimester urine culture - 2nd trimester negative.    [ ]  3rd tri Given genetic counselor info to discuss FOB testing    Patient Active Problem List   Diagnosis Date Noted  . Anemia 05/11/2019  . Gestational hypertension 05/09/2019  . Asthma affecting pregnancy in third trimester 04/17/2019  . Frequent headaches 04/03/2019  . Sickle cell trait (HCC) 02/25/2019  . Late prenatal care affecting pregnancy in second  trimester 02/24/2019  . Supervision of low-risk pregnancy 02/12/2019  . 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 Suicidal ideation 11/18/2016  . Asthma exacerbation 05/06/2013    Past Surgical History:  Procedure Laterality Date  . WISDOM TOOTH EXTRACTION       OB History    Gravida  2   Para  1   Term  1   Preterm  0   AB  1   Living  1     SAB  0   TAB  1   Ectopic  0   Multiple  0   Live Births  1           Family History  Problem Relation Age of Onset  . Healthy Mother   . Healthy Father     Social History   Tobacco Use  . Smoking status: Never Smoker  . Smokeless tobacco: Never Used  Vaping Use  . Vaping Use: Never used  Substance Use Topics  . Alcohol use: Not Currently  . Drug use: Not Currently    Types: Marijuana    Home Medications Prior to Admission medications   Medication Sig Start Date End Date Taking? Authorizing Provider  acetaminophen (TYLENOL) 325 MG tablet Take 2 tablets (650 mg total) by mouth every 6 (six) hours as needed (for pain scale < 4). 05/12/19   Fair, 05/14/19, MD  albuterol (VENTOLIN HFA) 108 (90 Base) MCG/ACT inhaler  Inhale 1-2 puffs into the lungs every 6 (six) hours as needed for wheezing or shortness of breath. 04/17/19   [place] BingPickens, Charlie, MD  amLODipine (NORVASC) 10 MG tablet Take 1 tablet (10 mg total) by mouth daily. 06/06/19   Nugent, Odie SeraNicole E, NP  butalbital-acetaminophen-caffeine (FIORICET) 50-325-40 MG tablet Take 1-2 tablets by mouth every 6 (six) hours as needed for headache. 05/19/19 05/18/20  Derwood KaplanNanavati, Ankit, MD  cyclobenzaprine (FLEXERIL) 10 MG tablet Take 1 tablet (10 mg total) by mouth every 8 (eight) hours as needed (for headaches). Patient not taking: Reported on 05/18/2019 04/03/19   Rasch, Victorino DikeJennifer I, NP  ferrous sulfate 325 (65 FE) MG tablet Take 1 tablet (325 mg total) by mouth every other day. Patient not taking: Reported on 06/06/2019 05/12/19   Joselyn ArrowFair,  Chelsea N, MD  ibuprofen (ADVIL) 600 MG tablet Take 1 tablet (600 mg total) by mouth every 6 (six) hours. Patient not taking: Reported on 06/06/2019 05/12/19   Joselyn ArrowFair, Chelsea N, MD  norethindrone (MICRONOR) 0.35 MG tablet Take 1 tablet (0.35 mg total) by mouth daily. 05/12/19   Fair, Hoyle Sauerhelsea N, MD  Prenatal MV-Min-FA-Omega-3 (PRENATAL GUMMIES/DHA & FA) 0.4-32.5 MG CHEW Chew 1 each by mouth daily. 04/03/19   Rasch, Harolyn RutherfordJennifer I, NP    Allergies    Patient has no known allergies.  Review of Systems   Review of Systems  All other systems reviewed and are negative.   Physical Exam Updated Vital Signs BP 124/71 (BP Location: Right Arm)   Pulse (!) 109   Temp 99 F (37.2 C) (Oral)   Resp (!) 24   LMP 09/22/2019   SpO2 95%   Physical Exam Vitals and nursing note reviewed. Exam conducted with a chaperone present.  Constitutional:      Appearance: She is ill-appearing.  HENT:     Head: Normocephalic and atraumatic.  Eyes:     General: No scleral icterus.    Conjunctiva/sclera: Conjunctivae normal.  Cardiovascular:     Rate and Rhythm: Regular rhythm. Tachycardia present.     Pulses: Normal pulses.     Heart sounds: Normal heart sounds.  Pulmonary:     Comments: Increased work of breathing.  Wheezing auscultated diffusely.  Patient has to catch her breath in between sentences.  No respiratory distress or accessory muscle use. Musculoskeletal:     Cervical back: Normal range of motion.  Skin:    General: Skin is dry.  Neurological:     Mental Status: She is alert.     GCS: GCS eye subscore is 4. GCS verbal subscore is 5. GCS motor subscore is 6.  Psychiatric:        Mood and Affect: Mood normal.        Behavior: Behavior normal.        Thought Content: Thought content normal.     ED Results / Procedures / Treatments   Labs (all labs ordered are listed, but only abnormal results are displayed) Labs Reviewed  CBC WITH DIFFERENTIAL/PLATELET - Abnormal; Notable for the following  components:      Result Value   RBC 5.28 (*)    MCV 73.3 (*)    MCH 23.7 (*)    RDW 19.0 (*)    Platelets 546 (*)    All other components within normal limits  COMPREHENSIVE METABOLIC PANEL - Abnormal; Notable for the following components:   Albumin 3.4 (*)    All other components within normal limits  D-DIMER, QUANTITATIVE (NOT AT [facility]) - Abnormal;  Notable for the following components:   D-Dimer, Quant 4.59 (*)    All other components within normal limits  LACTATE DEHYDROGENASE - Abnormal; Notable for the following components:   LDH 346 (*)    All other components within normal limits  TRIGLYCERIDES - Abnormal; Notable for the following components:   Triglycerides 211 (*)    All other components within normal limits  FIBRINOGEN - Abnormal; Notable for the following components:   Fibrinogen 736 (*)    All other components within normal limits  C-REACTIVE PROTEIN - Abnormal; Notable for the following components:   CRP 9.4 (*)    All other components within normal limits  CULTURE, BLOOD (ROUTINE X 2)  CULTURE, BLOOD (ROUTINE X 2)  LACTIC ACID, PLASMA  PROCALCITONIN  FERRITIN  LACTIC ACID, PLASMA  I-STAT BETA HCG BLOOD, ED ([facility], [facility], [facility] ONLY)  TROPONIN I (HIGH SENSITIVITY)  TROPONIN I (HIGH SENSITIVITY)    EKG EKG Interpretation  Date/Time:  Monday September 30 2019 18:35:26 EDT Ventricular Rate:  91 PR Interval:    QRS Duration: 69 QT Interval:  353 QTC Calculation: 435 R Axis:   68 Text Interpretation: Sinus rhythm Confirmed by Kristine Royal [phone]) on 09/30/2019 7:18:41 PM   Radiology DG Chest Port 1 View  Result Date: 09/30/2019 CLINICAL DATA:  COVID 19 positive, discharged 09/26/2019, worsening chest pain and dyspnea EXAM: PORTABLE CHEST 1 VIEW COMPARISON:  09/26/2019 chest radiograph. FINDINGS: Stable cardiomediastinal silhouette with normal heart size. No pneumothorax. No pleural effusion. Extensive patchy opacities in the mid to lower lungs bilaterally, slightly  worsened. IMPRESSION: Slight worsening of extensive patchy opacities in the mid to lower lungs bilaterally compatible with COVID-19 pneumonia. Electronically Signed   By: Delbert Phenix M.D.   On: 09/30/2019 19:00    Procedures Procedures (including critical care time)  Medications Ordered in ED Medications  albuterol (VENTOLIN HFA) 108 (90 Base) MCG/ACT inhaler 6 puff (6 puffs Inhalation Given 09/30/19 1902)  dexamethasone (DECADRON) injection 10 mg (10 mg Intravenous Given 09/30/19 1903)    ED Course  I have reviewed the triage vital signs and the nursing notes.  Pertinent labs & imaging results that were available during my care of the patient were reviewed by me and considered in my medical decision making (see chart for details).  Clinical Course as of Sep 29 2133  Mon Sep 30, 2019  2134 Spoke with Dr. Julian Reil who will see and admit patient.   [GG]    Clinical Course User Index [GG] Lorelee New, PA-C   MDM Rules/Calculators/A&P                          Patient is short of breath and hypoxic here in the ED and is now requiring supplemental oxygen via [place].  During my examination, she was oxygenating in the 90s on 5 L O2 via [place].  There was diffuse wheezing auscultated on exam and given her hypoxia in the setting of COVID-19 pneumonia, administered 10 mg Decadron and albuterol x6.  Labs Patient has positive COVID-19 testing from few days ago.  Her inflammatory markers are all consistent with COVID-19.  She does have elevated D-dimer to 4.59.  Imaging I personally reviewed plain films obtained of chest which demonstrate opacities in the lower lungs bilaterally consistent with multifocal COVID-19 pneumonia.  EKG I personally reviewed EKG and she appears to be in normal sinus rhythm.  I reevaluated patient after she was given 10 mg Decadron  and 6 puffs albuterol.  Patient's wheezing had improved dramatically, as has her work of breathing.  I challenged her by removing her from  oxygen and she dipped to 90% on RA before I subsequently placed her back on 2 L supplemental O2 via [place].  This is while she was at rest and not with any form of exertion or ambulation.  She continues to be tachypneic in the 30s, but her tachycardia has improved. Given that this was progressive onset of SOB/CP and what I believe to be asthma exacerbation superimposed on multifocal pneumonia, I initially held off on CTA imaging.  However, she still is tachypneic, takes oral contraceptives, is positive for COVID-19, and has a significant D-dimer elevation to 4.59.  Will proceed with imaging.    Spoke with Dr. Julian Reil who will see and admit patient.  Kattaleya Alia was evaluated in Emergency Department on 09/30/2019 for the symptoms described in the history of present illness. She was evaluated in the context of the global COVID-19 pandemic, which necessitated consideration that the patient might be at risk for infection with the SARS-CoV-2 virus that causes COVID-19. Institutional protocols and algorithms that pertain to the evaluation of patients at risk for COVID-19 are in a state of rapid change based on information released by regulatory bodies including the CDC and federal and state organizations. These policies and algorithms were followed during the patient's care in the ED.   Final Clinical Impression(s) / ED Diagnoses Final diagnoses:  Pneumonia due to COVID-19 virus  Hypoxia    Rx / DC Orders ED Discharge Orders    None       Elvera Maria 09/30/19 2135    Wynetta Fines, MD 09/30/19 2142

## 2019-09-30 NOTE — ED Triage Notes (Addendum)
Pt is COVID+ and was d/c on 8/12 from [facility] and was told to return to ED if symptoms worsened. Pt now c/o chest pain  and shob. Pt 80% on RA in traige. Pt tachypneic and having dyspnea with rest and exertion. Hx of asthma. Denies fever.

## 2019-10-01 ENCOUNTER — Encounter (HOSPITAL_COMMUNITY): Payer: Self-pay | Admitting: Internal Medicine

## 2019-10-01 LAB — CBC WITH DIFFERENTIAL/PLATELET
Abs Immature Granulocytes: 0.06 10*3/uL (ref 0.00–0.07)
Basophils Absolute: 0 10*3/uL (ref 0.0–0.1)
Basophils Relative: 1 %
Eosinophils Absolute: 0 10*3/uL (ref 0.0–0.5)
Eosinophils Relative: 0 %
HCT: 35.3 % — ABNORMAL LOW (ref 36.0–46.0)
Hemoglobin: 11.7 g/dL — ABNORMAL LOW (ref 12.0–15.0)
Immature Granulocytes: 2 %
Lymphocytes Relative: 20 %
Lymphs Abs: 0.8 10*3/uL (ref 0.7–4.0)
MCH: 24 pg — ABNORMAL LOW (ref 26.0–34.0)
MCHC: 33.1 g/dL (ref 30.0–36.0)
MCV: 72.3 fL — ABNORMAL LOW (ref 80.0–100.0)
Monocytes Absolute: 0.1 10*3/uL (ref 0.1–1.0)
Monocytes Relative: 3 %
Neutro Abs: 3.1 10*3/uL (ref 1.7–7.7)
Neutrophils Relative %: 74 %
Platelets: 551 10*3/uL — ABNORMAL HIGH (ref 150–400)
RBC: 4.88 MIL/uL (ref 3.87–5.11)
RDW: 18.5 % — ABNORMAL HIGH (ref 11.5–15.5)
WBC: 4.1 10*3/uL (ref 4.0–10.5)
nRBC: 0 % (ref 0.0–0.2)

## 2019-10-01 LAB — COMPREHENSIVE METABOLIC PANEL
ALT: 35 U/L (ref 0–44)
AST: 28 U/L (ref 15–41)
Albumin: 3.3 g/dL — ABNORMAL LOW (ref 3.5–5.0)
Alkaline Phosphatase: 45 U/L (ref 38–126)
Anion gap: 8 (ref 5–15)
BUN: 13 mg/dL (ref 6–20)
CO2: 26 mmol/L (ref 22–32)
Calcium: 9.2 mg/dL (ref 8.9–10.3)
Chloride: 106 mmol/L (ref 98–111)
Creatinine, Ser: 0.83 mg/dL (ref 0.44–1.00)
GFR calc Af Amer: >60 mL/min (ref >60)
GFR calc non Af Amer: >60 mL/min (ref >60)
Glucose, Bld: 133 mg/dL — ABNORMAL HIGH (ref 70–99)
Potassium: 5 mmol/L (ref 3.5–5.1)
Sodium: 140 mmol/L (ref 135–145)
Total Bilirubin: 0.1 mg/dL — ABNORMAL LOW (ref 0.3–1.2)
Total Protein: 8.1 g/dL (ref 6.5–8.1)

## 2019-10-01 LAB — ABO/RH: ABO/RH(D): A POS

## 2019-10-01 LAB — C-REACTIVE PROTEIN: CRP: 9.1 mg/dL — ABNORMAL HIGH (ref <1.0)

## 2019-10-01 LAB — D-DIMER, QUANTITATIVE: D-Dimer, Quant: 5.96 ug/mL-FEU — ABNORMAL HIGH (ref 0.00–0.50)

## 2019-10-01 MED ORDER — METHYLPREDNISOLONE SODIUM SUCC 125 MG IJ SOLR
60.0000 mg | Freq: Two times a day (BID) | INTRAMUSCULAR | Status: DC
  Administered 2019-10-01 – 2019-10-04 (×6): 60 mg via INTRAVENOUS
  Filled 2019-10-01 (×6): qty 2

## 2019-10-01 NOTE — ED Notes (Signed)
Pt given gingerale, ice and fresh blankets at this time. Remdesivir infusion started. States deep breathing is still painful, but lessened from previous. Offers no other complaints.

## 2019-10-01 NOTE — ED Notes (Signed)
Pt given lunch tray and gingerale. Offers no complaints. NAD

## 2019-10-01 NOTE — ED Notes (Signed)
Pt self proned with dinner tray at bedside. Resting without complaint.

## 2019-10-01 NOTE — Progress Notes (Signed)
PROGRESS NOTE    Brianna Bender  GNF:[id number] DOB: 08/15/2000 DOA: 09/30/2019 PCP: Patient, No Pcp Per   Brief Narrative:  Pt is a 19 y.o. female with medical history significant of sickle cell trait, asthma. Pt with 11 day h/o COVID symptoms. Now has CP and SOB. Did not get COVID vaccine. Tested COVID positive on 8/12. Developed CP and SOB since then. Was called on 8/12 for possible MAB treatment according NP telephone note, but they were not able to get hold of pt and left voicemail instead. In ED - New O2 requirement, satting as low as 80% on RA, satting mid 90s at rest on 2L via [place].  CXR with the typical bilateral covid PNA findings. CRP 9.4.  D.Dimer 4.5, Procalcitonin neg and WBC nl.  Assessment & Plan:   Active Problems:   Acute hypoxemic respiratory failure due to COVID-19 [facility](HCC)   Acute hypoxic resp failure due to COVID-19 Likely concurrent asthma exacerbation -Chest x-ray personally reviewed, multiple focal densities bilaterally consistent with COVID-19 pneumonia -CTA negative for PE -Continue supportive care, albuterol, incentive spirometry, flutter, proning up to 16 hours daily, early ambulation, daily ambulatory oxygen screening -Continue Remdesivir 09/30/2019 through 10/04/2019 -Continue Solu-Medrol through 10/09/2019 -No indication for actemra/baricitinib given minimal and improving symptoms SpO2: 94 % O2 Flow Rate (L/min): 1 L/min Recent Labs    09/30/19 1847 10/01/19 0510  DDIMER 4.59* 5.96*  FERRITIN 175  --   LDH 346*  --   CRP 9.4* 9.1*   Lab Results  Component Value Date   SARSCOV2NAA POSITIVE (A) 09/26/2019   SARSCOV2NAA NEGATIVE 05/09/2019    Sickle cell trait -Patient does complain of general malaise fatigue and weakness  -Diffuse pain "everywhere" nonspecific, likely in setting of viral illness above and previous fevers  -Less likely flare or sickle cell crisis given diffuse nature carries sickle cell trait only  DVT prophylaxis: Lovenox Code  Status: Full Family Communication: None present, unavailable over the phone  Status is: Inpatient  Dispo: The patient is from: Home              Anticipated d/c is to: Home              Anticipated d/c date is: 48 to 72 hours              Patient currently not medically stable for discharge due to ongoing need for IV fluids, IV antivirals, oxygen supplementation well above baseline  Consultants:   None  Procedures:   None planned  Antimicrobials:  Remdesivir through 8/20  Subjective: No acute issues or events overnight, patient's symptoms appear to have improved slightly from admission, still complains of ongoing fatigue malaise generalized weakness even with minimal exertion. Denies chest pain, headache, fever, chills.  Objective: Vitals:   10/01/19 0500 10/01/19 0520 10/01/19 0700 10/01/19 0730  BP: 127/89 128/90 117/79 121/83  Pulse: (!) 51 66 (!) 58 (!) 58  Resp: 20 20 (!) 26 19  Temp:  98.1 F (36.7 C)    TempSrc:  Oral    SpO2: 95% 97% 99% 99%    Intake/Output Summary (Last 24 hours) at 10/01/2019 0825 Last data filed at 10/01/2019 0003 Gross per 24 hour  Intake 250 ml  Output --  Net 250 ml   There were no vitals filed for this visit.  Examination:  General exam: Appears calm and comfortable  Respiratory system: Clear to auscultation. Respiratory effort normal. Cardiovascular system: S1 & S2 heard, RRR. No JVD, murmurs, rubs, gallops  or clicks. No pedal edema. Gastrointestinal system: Abdomen is nondistended, soft and nontender. No organomegaly or masses felt. Normal bowel sounds heard. Central nervous system: Alert and oriented. No focal neurological deficits. Extremities: Symmetric 5 x 5 power. Skin: No rashes, lesions or ulcers Psychiatry: Judgement and insight appear normal. Mood & affect appropriate.     Data Reviewed: I have personally reviewed following labs and imaging studies  CBC: Recent Labs  Lab 09/26/19 1527 09/30/19 1847  10/01/19 0510  WBC 4.9 6.3 4.1  NEUTROABS  --  4.6 3.1  HGB 11.9* 12.5 11.7*  HCT 36.2 38.7 35.3*  MCV 71.8* 73.3* 72.3*  PLT 269 546* 551*   Basic Metabolic Panel: Recent Labs  Lab 09/26/19 1527 09/30/19 1847 10/01/19 0510  NA 138 143 140  K 4.2 4.2 5.0  CL 104 104 106  CO2 25 26 26   GLUCOSE 104* 95 133*  BUN 10 13 13   CREATININE 1.06* 0.94 0.83  CALCIUM 8.6* 9.1 9.2   GFR: CrCl cannot be calculated (Unknown ideal weight.). Liver Function Tests: Recent Labs  Lab 09/30/19 1847 10/01/19 0510  AST 37 28  ALT 37 35  ALKPHOS 46 45  BILITOT 0.7 0.1*  PROT 8.1 8.1  ALBUMIN 3.4* 3.3*   No results for input(s): LIPASE, AMYLASE in the last 168 hours. No results for input(s): AMMONIA in the last 168 hours. Coagulation Profile: No results for input(s): INR, PROTIME in the last 168 hours. Cardiac Enzymes: No results for input(s): CKTOTAL, CKMB, CKMBINDEX, TROPONINI in the last 168 hours. BNP (last 3 results) No results for input(s): PROBNP in the last 8760 hours. HbA1C: No results for input(s): HGBA1C in the last 72 hours. CBG: No results for input(s): GLUCAP in the last 168 hours. Lipid Profile: Recent Labs    09/30/19 1847  TRIG 211*   Thyroid Function Tests: No results for input(s): TSH, T4TOTAL, FREET4, T3FREE, THYROIDAB in the last 72 hours. Anemia Panel: Recent Labs    09/30/19 1847  FERRITIN 175   Sepsis Labs: Recent Labs  Lab 09/30/19 1846 09/30/19 1847 09/30/19 2136  PROCALCITON  --  <0.10  --   LATICACIDVEN 1.8  --  1.7    Recent Results (from the past 240 hour(s))  SARS Coronavirus 2 by RT PCR (hospital order, performed in [facility] hospital lab) Nasopharyngeal Nasopharyngeal Swab     Status: Abnormal   Collection Time: 09/26/19  4:37 PM   Specimen: Nasopharyngeal Swab  Result Value Ref Range Status   SARS Coronavirus 2 POSITIVE (A) NEGATIVE Final    Comment: RESULT CALLED TO, READ BACK BY AND VERIFIED WITH: C.HALL AT 1801 ON 09/26/19  BY N.THOMPSON (NOTE) SARS-CoV-2 target nucleic acids are DETECTED  SARS-CoV-2 RNA is generally detectable in upper respiratory specimens  during the acute phase of infection.  Positive results are indicative  of the presence of the identified virus, but do not rule out bacterial infection or co-infection with other pathogens not detected by the test.  Clinical correlation with patient history and  other diagnostic information is necessary to determine patient infection status.  The expected result is negative.  Fact Sheet for Patients:   11/26/19   Fact Sheet for Healthcare Providers:   11/26/19    This test is not yet approved or cleared by the [employer] FDA and  has been authorized for detection and/or diagnosis of SARS-CoV-2 by FDA under an Emergency Use Authorization (EUA).  This EUA will remain in effect (meaning th is test can  be used) for the duration of  the COVID-19 declaration under Section 564(b)(1) of the Act, 21 U.S.C. section 360-bbb-3(b)(1), unless the authorization is terminated or revoked sooner.  Performed at [facility], [address], [place], [place] [address]   Blood Culture (routine x 2)     Status: None (Preliminary result)   Collection Time: 09/30/19  6:33 PM   Specimen: BLOOD  Result Value Ref Range Status   Specimen Description   Final    BLOOD LEFT ANTECUBITAL Performed at [facility], [address] [place], [place] [address]    Special Requests   Final    BOTTLES DRAWN AEROBIC AND ANAEROBIC Blood Culture adequate volume Performed at [facility], [address] [place], [place] [address]    Culture   Final    NO GROWTH < 12 HOURS Performed at [facility], [address] [place], [place] [address]    Report Status PENDING  Incomplete  Blood Culture (routine x 2)     Status: None (Preliminary result)    Collection Time: 09/30/19  6:46 PM   Specimen: Site Not Specified; Blood  Result Value Ref Range Status   Specimen Description   Final    SITE NOT SPECIFIED Performed at [facility], [address] [place], [place] [address]    Special Requests   Final    BOTTLES DRAWN AEROBIC AND ANAEROBIC Blood Culture results may not be optimal due to an inadequate volume of blood received in culture bottles Performed at [facility], [address], [place], [place] [address]    Culture   Final    NO GROWTH < 12 HOURS Performed at [facility], [address] [place], [place] [address]    Report Status PENDING  Incomplete         Radiology Studies: CT Angio Chest PE W/Cm &/Or Wo Cm  Result Date: 09/30/2019 CLINICAL DATA:  Shortness of breath, COVID-19 positive EXAM: CT ANGIOGRAPHY CHEST WITH CONTRAST TECHNIQUE: Multidetector CT imaging of the chest was performed using the standard protocol during bolus administration of intravenous contrast. Multiplanar CT image reconstructions and MIPs were obtained to evaluate the vascular anatomy. CONTRAST:  OMNIPAQUE IOHEXOL 350 MG/ML SOLN COMPARISON:  Radiograph 09/30/2019 FINDINGS: Cardiovascular: Satisfactory opacification of the pulmonary arteries. No large central or lobar pulmonary artery emboli. Respiratory motion significantly limits detection of smaller, subsegmental pulmonary emboli. Central pulmonary arteries are normal caliber. Normal heart size. No pericardial effusion. The aortic root is suboptimally assessed given cardiac pulsation artifact. No gross aortic abnormality. Proximal great vessels are unremarkable. Mediastinum/Nodes: Low-attenuation subcentimeter mediastinal and hilar lymph nodes are present, favored to be reactive. No worrisome enlarged nodes within the included lymphatic chains. No acute abnormality of the trachea or esophagus. Thyroid gland and thoracic inlet are unremarkable.  Lungs/Pleura: Multifocal areas of mixed ground-glass and consolidation throughout both lungs with additional more bandlike areas of opacity likely reflecting subsegmental atelectasis and/or developing scarring. No pneumothorax or effusion. Upper Abdomen: No acute abnormalities present in the visualized portions of the upper abdomen. Musculoskeletal: No chest wall mass or suspicious bone lesions identified. Review of the MIP images confirms the above findings. IMPRESSION: 1. Satisfactory opacification of the pulmonary arteries. No large central or lobar pulmonary artery emboli. Respiratory motion significantly limits detection of smaller, subsegmental pulmonary emboli. 2. Multifocal areas of mixed ground-glass and consolidation throughout both lungs compatible with multifocal infection in the setting of COVID-19. Additional  bandlike opacities may reflect subsegmental atelectatic change and/or scarring. Electronically Signed   By: Kreg Shropshire M.D.   On: 09/30/2019 22:09   DG Chest Port 1 View  Result Date: 09/30/2019 CLINICAL DATA:  COVID 19 positive, discharged 09/26/2019, worsening chest pain and dyspnea EXAM: PORTABLE CHEST 1 VIEW COMPARISON:  09/26/2019 chest radiograph. FINDINGS: Stable cardiomediastinal silhouette with normal heart size. No pneumothorax. No pleural effusion. Extensive patchy opacities in the mid to lower lungs bilaterally, slightly worsened. IMPRESSION: Slight worsening of extensive patchy opacities in the mid to lower lungs bilaterally compatible with COVID-19 pneumonia. Electronically Signed   By: Delbert Phenix M.D.   On: 09/30/2019 19:00    Scheduled Meds: . dexamethasone  6 mg Oral Q24H  . enoxaparin (LOVENOX) injection  40 mg Subcutaneous Q24H   Continuous Infusions: . remdesivir 100 mg in NS 100 mL       LOS: 1 day   Time spent:  Azucena Fallen, DO [facility]  If 7PM-7AM, please contact night-coverage [email or web address]  10/01/2019, 8:25 AM

## 2019-10-01 NOTE — ED Notes (Signed)
Assumed care of pt at this time. Pt resting in stretcher, no s/sx of acute distress at this time.  

## 2019-10-02 ENCOUNTER — Inpatient Hospital Stay (HOSPITAL_COMMUNITY)
Admit: 2019-10-02 | Discharge: 2019-10-02 | Disposition: A | Discharge status: Home / Self Care | Payer: Medicaid Other | Attending: Family Medicine | Admitting: Family Medicine

## 2019-10-02 DIAGNOSIS — R7989 Other specified abnormal findings of blood chemistry: Secondary | ICD-10-CM | POA: Diagnosis not present

## 2019-10-02 LAB — COMPREHENSIVE METABOLIC PANEL
ALT: 26 U/L (ref 0–44)
AST: 22 U/L (ref 15–41)
Albumin: 2.9 g/dL — ABNORMAL LOW (ref 3.5–5.0)
Alkaline Phosphatase: 39 U/L (ref 38–126)
Anion gap: 11 (ref 5–15)
BUN: 13 mg/dL (ref 6–20)
CO2: 24 mmol/L (ref 22–32)
Calcium: 8.7 mg/dL — ABNORMAL LOW (ref 8.9–10.3)
Chloride: 105 mmol/L (ref 98–111)
Creatinine, Ser: 0.77 mg/dL (ref 0.44–1.00)
GFR calc Af Amer: >60 mL/min (ref >60)
GFR calc non Af Amer: >60 mL/min (ref >60)
Glucose, Bld: 122 mg/dL — ABNORMAL HIGH (ref 70–99)
Potassium: 4.6 mmol/L (ref 3.5–5.1)
Sodium: 140 mmol/L (ref 135–145)
Total Bilirubin: 0.6 mg/dL (ref 0.3–1.2)
Total Protein: 6.7 g/dL (ref 6.5–8.1)

## 2019-10-02 LAB — CBC WITH DIFFERENTIAL/PLATELET
Abs Immature Granulocytes: 0.26 10*3/uL — ABNORMAL HIGH (ref 0.00–0.07)
Basophils Absolute: 0 10*3/uL (ref 0.0–0.1)
Basophils Relative: 0 %
Eosinophils Absolute: 0 10*3/uL (ref 0.0–0.5)
Eosinophils Relative: 0 %
HCT: 33.1 % — ABNORMAL LOW (ref 36.0–46.0)
Hemoglobin: 10.9 g/dL — ABNORMAL LOW (ref 12.0–15.0)
Immature Granulocytes: 4 %
Lymphocytes Relative: 18 %
Lymphs Abs: 1.3 10*3/uL (ref 0.7–4.0)
MCH: 24 pg — ABNORMAL LOW (ref 26.0–34.0)
MCHC: 32.9 g/dL (ref 30.0–36.0)
MCV: 72.7 fL — ABNORMAL LOW (ref 80.0–100.0)
Monocytes Absolute: 0.4 10*3/uL (ref 0.1–1.0)
Monocytes Relative: 5 %
Neutro Abs: 5.5 10*3/uL (ref 1.7–7.7)
Neutrophils Relative %: 73 %
Platelets: 514 10*3/uL — ABNORMAL HIGH (ref 150–400)
RBC: 4.55 MIL/uL (ref 3.87–5.11)
RDW: 18.3 % — ABNORMAL HIGH (ref 11.5–15.5)
WBC: 7.5 10*3/uL (ref 4.0–10.5)
nRBC: 0 % (ref 0.0–0.2)

## 2019-10-02 LAB — C-REACTIVE PROTEIN: CRP: 4.3 mg/dL — ABNORMAL HIGH (ref <1.0)

## 2019-10-02 LAB — D-DIMER, QUANTITATIVE: D-Dimer, Quant: 8.51 ug/mL-FEU — ABNORMAL HIGH (ref 0.00–0.50)

## 2019-10-02 MED ORDER — ENOXAPARIN SODIUM 40 MG/0.4ML ~~LOC~~ SOLN
40.0000 mg | Freq: Two times a day (BID) | SUBCUTANEOUS | Status: DC
  Administered 2019-10-02 – 2019-10-03 (×4): 40 mg via SUBCUTANEOUS
  Filled 2019-10-02 (×5): qty 0.4

## 2019-10-02 NOTE — Progress Notes (Signed)
Lower extremity venous bilateral study completed.   Please see CV Proc for preliminary results.   Brianna Bender  

## 2019-10-02 NOTE — Progress Notes (Signed)
PROGRESS NOTE  Brianna Bender  JME:[id number] DOB: 08-21-2000 DOA: 09/30/2019 PCP: Patient, No Pcp Per   Brief Narrative: Pt is a 18 y.o.femalewith medical history significant ofsickle cell trait, asthma. Pt with 11 day h/o COVID symptoms. Now has CP and SOB. Did not get COVID vaccine. Tested COVID positive on 8/12. Developed CP and SOB since then. Was called on 8/12 for possible MAB treatment according NP telephone note, but they were not able to get hold of pt and left voicemail instead. In ED - New O2 requirement, satting as low as 80% on RA, satting mid 90s at rest on 2L via [place].  CXR with the typical bilateral covid PNA findings. CRP 9.4. D.Dimer 4.5, Procalcitonin neg and WBC nl.  Assessment & Plan: Active Problems:   Acute hypoxemic respiratory failure due to COVID-19 [facility])  Acute hypoxemic respiratory failure due to covid-19 pneumonia, asthma exacerbation: SARS-CoV-2 PCR positive on 8/12. Not vaccinated. CTA reveals significant diffuse airspace opacities moderate-severe, worse than degree of hypoxemia at this time. CRP down, PCT neg.  - Continue remdesivir x5 days (8/16 - 8/20) - Continue steroids x10 days. No current wheezing. - Encourage OOB, IS, FV, and awake proning if able - Continue airborne, contact precautions for 21 days from positive testing. - Monitor CMP and inflammatory markers - Discussed heightened risk of VTE given sickle cell trait, covid-19 infection, hospitalization and elevated d-dimer (from 5 to 8 thus far, albeit w/o definite PE on CTA). Will check LE U/S and increase lovenox to q12h dosing/intermediate dose. Pt has anxiety about Pentress administration, but will try. If unable to take, would use xarelto given clear risk of VTE.    Sickle cell trait:   Morbid obesity: Estimated body mass index is 43.16 kg/m as calculated from the following:   Height as of 05/18/19: 5\' 2"  (1.575 m).   Weight as of 05/18/19: 107 kg.  - Worsens prognosis despite young age.   DVT  prophylaxis: Lovenox Code Status: Full Family Communication: None at bedside Disposition Plan:  Status is: Inpatient  Remains inpatient appropriate because:Inpatient level of care appropriate due to severity of illness   Dispo: The patient is from: Home              Anticipated d/c is to: Home              Anticipated d/c date is: 2 days              Patient currently is not medically stable to d/c.  Consultants:   None  Procedures:   None  Antimicrobials:  Remdesivir   Subjective: Eating breakfast, very hungry. Feels a little less terrible than at admission, though still diffusely feels unwell. Shortness of breath is minimal at rest, +cough. Very anxious with lovenox administration, so this was not previously given. Denies chest pain, palpitations.   Objective: Vitals:   10/02/19 0819 10/02/19 0829 10/02/19 0839 10/02/19 0849  BP:  122/90    Pulse: (!) 50 (!) 49 (!) 51 (!) 50  Resp:      Temp:      TempSrc:      SpO2: 96% 97% 95% 94%    Intake/Output Summary (Last 24 hours) at 10/02/2019 0941 Last data filed at 10/01/2019 1002 Gross per 24 hour  Intake 100 ml  Output --  Net 100 ml   There were no vitals filed for this visit.  Gen: Nontoxic female in no distress Pulm: Non-labored but tachypneic with mild crackles bilaterally. No wheezes  CV: Regular bradycardia. No murmur, rub, or gallop. No JVD, no pedal edema. GI: Abdomen soft, non-tender, non-distended, with normoactive bowel sounds. No organomegaly or masses felt. Ext: Warm, no deformities Skin: No rashes, lesions or ulcers on visualized skin. Neuro: Alert and oriented. No focal neurological deficits. Psych: Judgement and insight appear normal. Mood & affect appropriate.   Data Reviewed: I have personally reviewed following labs and imaging studies  CBC: Recent Labs  Lab 09/26/19 1527 09/30/19 1847 10/01/19 0510 10/02/19 0518  WBC 4.9 6.3 4.1 7.5  NEUTROABS  --  4.6 3.1 5.5  HGB 11.9* 12.5 11.7*  10.9*  HCT 36.2 38.7 35.3* 33.1*  MCV 71.8* 73.3* 72.3* 72.7*  PLT 269 546* 551* 514*   Basic Metabolic Panel: Recent Labs  Lab 09/26/19 1527 09/30/19 1847 10/01/19 0510 10/02/19 0518  NA 138 143 140 140  K 4.2 4.2 5.0 4.6  CL 104 104 106 105  CO2 25 26 26 24   GLUCOSE 104* 95 133* 122*  BUN 10 13 13 13   CREATININE 1.06* 0.94 0.83 0.77  CALCIUM 8.6* 9.1 9.2 8.7*   GFR: CrCl cannot be calculated (Unknown ideal weight.). Liver Function Tests: Recent Labs  Lab 09/30/19 1847 10/01/19 0510 10/02/19 0518  AST 37 28 22  ALT 37 35 26  ALKPHOS 46 45 39  BILITOT 0.7 0.1* 0.6  PROT 8.1 8.1 6.7  ALBUMIN 3.4* 3.3* 2.9*   No results for input(s): LIPASE, AMYLASE in the last 168 hours. No results for input(s): AMMONIA in the last 168 hours. Coagulation Profile: No results for input(s): INR, PROTIME in the last 168 hours. Cardiac Enzymes: No results for input(s): CKTOTAL, CKMB, CKMBINDEX, TROPONINI in the last 168 hours. BNP (last 3 results) No results for input(s): PROBNP in the last 8760 hours. HbA1C: No results for input(s): HGBA1C in the last 72 hours. CBG: No results for input(s): GLUCAP in the last 168 hours. Lipid Profile: Recent Labs    09/30/19 1847  TRIG 211*   Thyroid Function Tests: No results for input(s): TSH, T4TOTAL, FREET4, T3FREE, THYROIDAB in the last 72 hours. Anemia Panel: Recent Labs    09/30/19 1847  FERRITIN 175   Urine analysis:    Component Value Date/Time   COLORURINE YELLOW 06/06/2019 2135   APPEARANCEUR HAZY (A) 06/06/2019 2135   LABSPEC 1.010 06/06/2019 2135   PHURINE 7.0 06/06/2019 2135   GLUCOSEU 50 (A) 06/06/2019 2135   HGBUR MODERATE (A) 06/06/2019 2135   BILIRUBINUR NEGATIVE 06/06/2019 2135   KETONESUR NEGATIVE 06/06/2019 2135   PROTEINUR NEGATIVE 06/06/2019 2135   NITRITE NEGATIVE 06/06/2019 2135   LEUKOCYTESUR SMALL (A) 06/06/2019 2135   Recent Results (from the past 240 hour(s))  SARS Coronavirus 2 by RT PCR (hospital  order, performed in [facility] hospital lab) Nasopharyngeal Nasopharyngeal Swab     Status: Abnormal   Collection Time: 09/26/19  4:37 PM   Specimen: Nasopharyngeal Swab  Result Value Ref Range Status   SARS Coronavirus 2 POSITIVE (A) NEGATIVE Final    Comment: RESULT CALLED TO, READ BACK BY AND VERIFIED WITH: C.HALL AT 1801 ON 09/26/19 BY N.THOMPSON (NOTE) SARS-CoV-2 target nucleic acids are DETECTED  SARS-CoV-2 RNA is generally detectable in upper respiratory specimens  during the acute phase of infection.  Positive results are indicative  of the presence of the identified virus, but do not rule out bacterial infection or co-infection with other pathogens not detected by the test.  Clinical correlation with patient history and  other diagnostic information is  necessary to determine patient infection status.  The expected result is negative.  Fact Sheet for Patients:   BoilerBrush.com.cyhttps://www.fda.gov/media/136312/download   Fact Sheet for Healthcare Providers:   https://pope.com/https://www.fda.gov/media/136313/download    This test is not yet approved or cleared by the [place] FDA and  has been authorized for detection and/or diagnosis of SARS-CoV-2 by FDA under an Emergency Use Authorization (EUA).  This EUA will remain in effect (meaning th is test can be used) for the duration of  the COVID-19 declaration under Section 564(b)(1) of the Act, 21 U.S.C. section 360-bbb-3(b)(1), unless the authorization is terminated or revoked sooner.  Performed at [facility], [address] [place], [place] [address]   Blood Culture (routine x 2)     Status: None (Preliminary result)   Collection Time: 09/30/19  6:33 PM   Specimen: BLOOD  Result Value Ref Range Status   Specimen Description   Final    BLOOD LEFT ANTECUBITAL Performed at [facility], [address] [place], [place] [address]    Special Requests   Final    BOTTLES DRAWN AEROBIC AND ANAEROBIC Blood  Culture adequate volume Performed at [facility], [address] [place], [place] [address]    Culture   Final    NO GROWTH 2 DAYS Performed at [facility], [address] [place], [place] [address]    Report Status PENDING  Incomplete  Blood Culture (routine x 2)     Status: None (Preliminary result)   Collection Time: 09/30/19  6:46 PM   Specimen: Site Not Specified; Blood  Result Value Ref Range Status   Specimen Description   Final    SITE NOT SPECIFIED Performed at [facility], [address] [place], [place] [address]    Special Requests   Final    BOTTLES DRAWN AEROBIC AND ANAEROBIC Blood Culture results may not be optimal due to an inadequate volume of blood received in culture bottles Performed at [facility], [address] [place], [place] [address]    Culture   Final    NO GROWTH 2 DAYS Performed at [facility], [address] [place], [place] [address]    Report Status PENDING  Incomplete      Radiology Studies: CT Angio Chest PE W/Cm &/Or Wo Cm  Result Date: 09/30/2019 CLINICAL DATA:  Shortness of breath, COVID-19 positive EXAM: CT ANGIOGRAPHY CHEST WITH CONTRAST TECHNIQUE: Multidetector CT imaging of the chest was performed using the standard protocol during bolus administration of intravenous contrast. Multiplanar CT image reconstructions and MIPs were obtained to evaluate the vascular anatomy. CONTRAST:  100mL OMNIPAQUE IOHEXOL 350 MG/ML SOLN COMPARISON:  Radiograph 09/30/2019 FINDINGS: Cardiovascular: Satisfactory opacification of the pulmonary arteries. No large central or lobar pulmonary artery emboli. Respiratory motion significantly limits detection of smaller, subsegmental pulmonary emboli. Central pulmonary arteries are normal caliber. Normal heart size. No pericardial effusion. The aortic root is suboptimally assessed given cardiac pulsation artifact. No gross aortic abnormality.  Proximal great vessels are unremarkable. Mediastinum/Nodes: Low-attenuation subcentimeter mediastinal and hilar lymph nodes are present, favored to be reactive. No worrisome enlarged nodes within the included lymphatic chains. No acute abnormality of the trachea or esophagus. Thyroid gland and thoracic inlet are unremarkable. Lungs/Pleura: Multifocal areas of mixed ground-glass and consolidation throughout both lungs with additional more bandlike areas of opacity likely reflecting subsegmental atelectasis and/or developing scarring. No pneumothorax or effusion. Upper Abdomen: No acute abnormalities present in the  visualized portions of the upper abdomen. Musculoskeletal: No chest wall mass or suspicious bone lesions identified. Review of the MIP images confirms the above findings. IMPRESSION: 1. Satisfactory opacification of the pulmonary arteries. No large central or lobar pulmonary artery emboli. Respiratory motion significantly limits detection of smaller, subsegmental pulmonary emboli. 2. Multifocal areas of mixed ground-glass and consolidation throughout both lungs compatible with multifocal infection in the setting of COVID-19. Additional bandlike opacities may reflect subsegmental atelectatic change and/or scarring. Electronically Signed   By: Kreg Shropshire M.D.   On: 09/30/2019 22:09   DG Chest Port 1 View  Result Date: 09/30/2019 CLINICAL DATA:  COVID 19 positive, discharged 09/26/2019, worsening chest pain and dyspnea EXAM: PORTABLE CHEST 1 VIEW COMPARISON:  09/26/2019 chest radiograph. FINDINGS: Stable cardiomediastinal silhouette with normal heart size. No pneumothorax. No pleural effusion. Extensive patchy opacities in the mid to lower lungs bilaterally, slightly worsened. IMPRESSION: Slight worsening of extensive patchy opacities in the mid to lower lungs bilaterally compatible with COVID-19 pneumonia. Electronically Signed   By: Delbert Phenix M.D.   On: 09/30/2019 19:00    Scheduled Meds: .  enoxaparin (LOVENOX) injection  40 mg Subcutaneous Q12H  . methylPREDNISolone (SOLU-MEDROL) injection  60 mg Intravenous Q12H   Continuous Infusions: . remdesivir 100 mg in NS 100 mL Stopped (10/01/19 1002)     LOS: 2 days   Time spent: 25 minutes.  Tyrone Nine, MD [facility] [email or web address] 10/02/2019, 9:41 AM

## 2019-10-03 LAB — CBC WITH DIFFERENTIAL/PLATELET
Abs Immature Granulocytes: 0.41 10*3/uL — ABNORMAL HIGH (ref 0.00–0.07)
Basophils Absolute: 0 10*3/uL (ref 0.0–0.1)
Basophils Relative: 0 %
Eosinophils Absolute: 0 10*3/uL (ref 0.0–0.5)
Eosinophils Relative: 0 %
HCT: 35.6 % — ABNORMAL LOW (ref 36.0–46.0)
Hemoglobin: 11.4 g/dL — ABNORMAL LOW (ref 12.0–15.0)
Immature Granulocytes: 4 %
Lymphocytes Relative: 17 %
Lymphs Abs: 1.8 10*3/uL (ref 0.7–4.0)
MCH: 23.5 pg — ABNORMAL LOW (ref 26.0–34.0)
MCHC: 32 g/dL (ref 30.0–36.0)
MCV: 73.3 fL — ABNORMAL LOW (ref 80.0–100.0)
Monocytes Absolute: 0.8 10*3/uL (ref 0.1–1.0)
Monocytes Relative: 8 %
Neutro Abs: 7.2 10*3/uL (ref 1.7–7.7)
Neutrophils Relative %: 71 %
Platelets: 558 10*3/uL — ABNORMAL HIGH (ref 150–400)
RBC: 4.86 MIL/uL (ref 3.87–5.11)
RDW: 18.6 % — ABNORMAL HIGH (ref 11.5–15.5)
WBC: 10.1 10*3/uL (ref 4.0–10.5)
nRBC: 0.2 % (ref 0.0–0.2)

## 2019-10-03 LAB — COMPREHENSIVE METABOLIC PANEL
ALT: 26 U/L (ref 0–44)
AST: 18 U/L (ref 15–41)
Albumin: 3.1 g/dL — ABNORMAL LOW (ref 3.5–5.0)
Alkaline Phosphatase: 40 U/L (ref 38–126)
Anion gap: 8 (ref 5–15)
BUN: 18 mg/dL (ref 6–20)
CO2: 27 mmol/L (ref 22–32)
Calcium: 8.9 mg/dL (ref 8.9–10.3)
Chloride: 106 mmol/L (ref 98–111)
Creatinine, Ser: 0.86 mg/dL (ref 0.44–1.00)
GFR calc Af Amer: >60 mL/min (ref >60)
GFR calc non Af Amer: >60 mL/min (ref >60)
Glucose, Bld: 106 mg/dL — ABNORMAL HIGH (ref 70–99)
Potassium: 4.2 mmol/L (ref 3.5–5.1)
Sodium: 141 mmol/L (ref 135–145)
Total Bilirubin: 0.4 mg/dL (ref 0.3–1.2)
Total Protein: 6.9 g/dL (ref 6.5–8.1)

## 2019-10-03 LAB — D-DIMER, QUANTITATIVE: D-Dimer, Quant: 13.51 ug/mL-FEU — ABNORMAL HIGH (ref 0.00–0.50)

## 2019-10-03 LAB — C-REACTIVE PROTEIN: CRP: 2 mg/dL — ABNORMAL HIGH (ref <1.0)

## 2019-10-03 NOTE — Progress Notes (Signed)
PROGRESS NOTE  Brianna Bender  ZOX:[id number] DOB: 14-Mar-2000 DOA: 09/30/2019 PCP: Patient, No Pcp Per   Brief Narrative: Pt is a 18 y.o.femalewith medical history significant ofsickle cell trait, asthma. Pt with 11 day h/o COVID symptoms. Now has CP and SOB. Did not get COVID vaccine. Tested COVID positive on 8/12. Developed CP and SOB since then. Was called on 8/12 for possible MAB treatment according NP telephone note, but they were not able to get hold of pt and left voicemail instead. In ED - New O2 requirement, satting as low as 80% on RA, satting mid 90s at rest on 2L via [place].  CXR with the typical bilateral covid PNA findings. CRP 9.4. D.Dimer 4.5, Procalcitonin neg and WBC nl.  Assessment & Plan: Active Problems:   Acute hypoxemic respiratory failure due to COVID-19 [facility](HCC)  Acute hypoxemic respiratory failure due to covid-19 pneumonia, asthma exacerbation: SARS-CoV-2 PCR positive on 8/12. Not vaccinated. CTA reveals significant diffuse airspace opacities moderate-severe, worse than degree of hypoxemia at this time. CRP down, PCT neg.  - Wean to room air if able over next 24 hours in hopes of discharging after 5th dose of remdesivir.  - Continue remdesivir x5 days (8/16 - 8/20) - Continue steroids x10 days. No current wheezing. Continued diminution of CRP noted. - Encourage OOB, IS, FV, and awake proning if able - Continue airborne, contact precautions for 21 days from positive testing. - Monitor CMP and inflammatory markers - Discussed heightened risk of VTE given sickle cell trait, covid-19 infection, hospitalization and elevated d-dimer (4 >> 13). LE U/S negative for DVT and initial CTA chest negative for PE, though limited sensitivity due to respiratory artifact. Will continue intermediate dosing of lovenox. If develops worsening hypoxia/pleuritic chest pain or if d-dimer continues rising, would be forced to recheck CTA chest. Note CrCl >17810ml/min.    Sickle cell trait: ?if d-dimer  rising related to subclinical sickling due to acute illness vs. true thrombotic byproducts.   Morbid obesity: Estimated body mass index is 34.9 kg/m as calculated from the following:   Height as of this encounter: 5\' 3"  (1.6 m).   Weight as of this encounter: 89.4 kg.  - Worsens prognosis despite young age.   DVT prophylaxis: Lovenox Code Status: Full Family Communication: None at bedside Disposition Plan:  Status is: Inpatient  Remains inpatient appropriate because:Inpatient level of care appropriate due to severity of illness   Dispo: The patient is from: Home              Anticipated d/c is to: Home              Anticipated d/c date is: 1 day - Wean to room air if able over next 24 hours in hopes of discharging after 5th dose of remdesivir.              Patient currently is not medically stable to d/c.  Consultants:   None  Procedures:   None  Antimicrobials:  Remdesivir   Subjective: No new complaints, denies chest pain or leg swelling or hx VTE. Shortness of breath is minimal, though she's still on supplemental oxygen. Feels tired.  Objective: Vitals:   10/03/19 0955 10/03/19 1208 10/03/19 1253 10/03/19 1411  BP: 118/84 120/90 103/78 111/84  Pulse: (!) 50 71 60 (!) 57  Resp: 17 18 15 17   Temp:      TempSrc:      SpO2: 95% 98% 97% 98%  Weight:      Height:  No intake or output data in the 24 hours ending 10/03/19 1452 Filed Weights   10/02/19 1012  Weight: 89.4 kg   Gen: Tired-appearing young female in no distress Pulm: Nonlabored breathing supplemental oxygen, clear bilaterally. CV: Regular rate and rhythm. No murmur, rub, or gallop. No JVD, no dependent edema. GI: Abdomen soft, non-tender, non-distended, with normoactive bowel sounds.  Ext: Warm, no deformities Skin: No rashes, lesions or ulcers on visualized skin. Neuro: Alert and oriented. No focal neurological deficits. Psych: Judgement and insight appear fair. Mood euthymic & affect  congruent. Behavior is appropriate.    Data Reviewed: I have personally reviewed following labs and imaging studies  CBC: Recent Labs  Lab 09/26/19 1527 09/30/19 1847 10/01/19 0510 10/02/19 0518 10/03/19 0634  WBC 4.9 6.3 4.1 7.5 10.1  NEUTROABS  --  4.6 3.1 5.5 7.2  HGB 11.9* 12.5 11.7* 10.9* 11.4*  HCT 36.2 38.7 35.3* 33.1* 35.6*  MCV 71.8* 73.3* 72.3* 72.7* 73.3*  PLT 269 546* 551* 514* 558*   Basic Metabolic Panel: Recent Labs  Lab 09/26/19 1527 09/30/19 1847 10/01/19 0510 10/02/19 0518 10/03/19 0634  NA 138 143 140 140 141  K 4.2 4.2 5.0 4.6 4.2  CL 104 104 106 105 106  CO2 25 26 26 24 27   GLUCOSE 104* 95 133* 122* 106*  BUN 10 13 13 13 18   CREATININE 1.06* 0.94 0.83 0.77 0.86  CALCIUM 8.6* 9.1 9.2 8.7* 8.9   GFR: Estimated Creatinine Clearance: 110.7 mL/min (by C-G formula based on SCr of 0.86 mg/dL). Liver Function Tests: Recent Labs  Lab 09/30/19 1847 10/01/19 0510 10/02/19 0518 10/03/19 0634  AST 37 28 22 18   ALT 37 35 26 26  ALKPHOS 46 45 39 40  BILITOT 0.7 0.1* 0.6 0.4  PROT 8.1 8.1 6.7 6.9  ALBUMIN 3.4* 3.3* 2.9* 3.1*   No results for input(s): LIPASE, AMYLASE in the last 168 hours. No results for input(s): AMMONIA in the last 168 hours. Coagulation Profile: No results for input(s): INR, PROTIME in the last 168 hours. Cardiac Enzymes: No results for input(s): CKTOTAL, CKMB, CKMBINDEX, TROPONINI in the last 168 hours. BNP (last 3 results) No results for input(s): PROBNP in the last 8760 hours. HbA1C: No results for input(s): HGBA1C in the last 72 hours. CBG: No results for input(s): GLUCAP in the last 168 hours. Lipid Profile: Recent Labs    09/30/19 1847  TRIG 211*   Thyroid Function Tests: No results for input(s): TSH, T4TOTAL, FREET4, T3FREE, THYROIDAB in the last 72 hours. Anemia Panel: Recent Labs    09/30/19 1847  FERRITIN 175   Urine analysis:    Component Value Date/Time   COLORURINE YELLOW 06/06/2019 2135    APPEARANCEUR HAZY (A) 06/06/2019 2135   LABSPEC 1.010 06/06/2019 2135   PHURINE 7.0 06/06/2019 2135   GLUCOSEU 50 (A) 06/06/2019 2135   HGBUR MODERATE (A) 06/06/2019 2135   BILIRUBINUR NEGATIVE 06/06/2019 2135   KETONESUR NEGATIVE 06/06/2019 2135   PROTEINUR NEGATIVE 06/06/2019 2135   NITRITE NEGATIVE 06/06/2019 2135   LEUKOCYTESUR SMALL (A) 06/06/2019 2135   Recent Results (from the past 240 hour(s))  SARS Coronavirus 2 by RT PCR (hospital order, performed in [facility] hospital lab) Nasopharyngeal Nasopharyngeal Swab     Status: Abnormal   Collection Time: 09/26/19  4:37 PM   Specimen: Nasopharyngeal Swab  Result Value Ref Range Status   SARS Coronavirus 2 POSITIVE (A) NEGATIVE Final    Comment: RESULT CALLED TO, READ BACK BY AND VERIFIED  WITH: C.HALL AT 1801 ON 09/26/19 BY N.THOMPSON (NOTE) SARS-CoV-2 target nucleic acids are DETECTED  SARS-CoV-2 RNA is generally detectable in upper respiratory specimens  during the acute phase of infection.  Positive results are indicative  of the presence of the identified virus, but do not rule out bacterial infection or co-infection with other pathogens not detected by the test.  Clinical correlation with patient history and  other diagnostic information is necessary to determine patient infection status.  The expected result is negative.  Fact Sheet for Patients:   BoilerBrush.com.cy   Fact Sheet for Healthcare Providers:   https://pope.com/    This test is not yet approved or cleared by the [place] FDA and  has been authorized for detection and/or diagnosis of SARS-CoV-2 by FDA under an Emergency Use Authorization (EUA).  This EUA will remain in effect (meaning th is test can be used) for the duration of  the COVID-19 declaration under Section 564(b)(1) of the Act, 21 U.S.C. section 360-bbb-3(b)(1), unless the authorization is terminated or revoked sooner.  Performed at [facility], [address], [place], [place] [address]   Blood Culture (routine x 2)     Status: None (Preliminary result)   Collection Time: 09/30/19  6:33 PM   Specimen: BLOOD  Result Value Ref Range Status   Specimen Description   Final    BLOOD LEFT ANTECUBITAL Performed at [facility], [address] [place], [place] [address]    Special Requests   Final    BOTTLES DRAWN AEROBIC AND ANAEROBIC Blood Culture adequate volume Performed at [facility], [address] [place], [place] [address]    Culture   Final    NO GROWTH 3 DAYS Performed at [facility], [address], [place], [place] [address]    Report Status PENDING  Incomplete  Blood Culture (routine x 2)     Status: None (Preliminary result)   Collection Time: 09/30/19  6:46 PM   Specimen: Site Not Specified; Blood  Result Value Ref Range Status   Specimen Description   Final    SITE NOT SPECIFIED Performed at [facility], [address] [place], [place] [address]    Special Requests   Final    BOTTLES DRAWN AEROBIC AND ANAEROBIC Blood Culture results may not be optimal due to an inadequate volume of blood received in culture bottles Performed at [facility], [address], [place], [place] [address]    Culture   Final    NO GROWTH 3 DAYS Performed at [facility], [address] [place], [place] [address]    Report Status PENDING  Incomplete      Radiology Studies: VAS [place] LOWER EXTREMITY VENOUS (DVT)  Result Date: 10/02/2019  Lower Venous DVTStudy Indications: Elevated d-dimer.  Limitations: Poor ultrasound/tissue interface and body habitus. Comparison Study: No prior studies. Performing Technologist: Jean Rosenthal  Examination Guidelines: A complete evaluation includes B-mode imaging, spectral Doppler, color Doppler, and power Doppler as needed of all accessible portions of each vessel. Bilateral testing  is considered an integral part of a complete examination. Limited examinations for reoccurring indications may be performed as noted. The reflux portion of the exam is performed with the patient in reverse Trendelenburg.  +---------+---------------+---------+-----------+----------+--------------+ RIGHT    CompressibilityPhasicitySpontaneityPropertiesThrombus Aging +---------+---------------+---------+-----------+----------+--------------+ CFV      Full           Yes      Yes                                 +---------+---------------+---------+-----------+----------+--------------+  SFJ      Full                                                        +---------+---------------+---------+-----------+----------+--------------+ FV Prox  Full                                                        +---------+---------------+---------+-----------+----------+--------------+ FV Mid   Full           Yes      Yes                                 +---------+---------------+---------+-----------+----------+--------------+ FV DistalFull           Yes      Yes                                 +---------+---------------+---------+-----------+----------+--------------+ PFV      Full                                                        +---------+---------------+---------+-----------+----------+--------------+ POP      Full           Yes      Yes                                 +---------+---------------+---------+-----------+----------+--------------+ PTV      Full                                                        +---------+---------------+---------+-----------+----------+--------------+ PERO     Full                                                        +---------+---------------+---------+-----------+----------+--------------+   +---------+---------------+---------+-----------+----------+--------------+ LEFT      CompressibilityPhasicitySpontaneityPropertiesThrombus Aging +---------+---------------+---------+-----------+----------+--------------+ CFV      Full           Yes      Yes                                 +---------+---------------+---------+-----------+----------+--------------+ SFJ      Full                                                        +---------+---------------+---------+-----------+----------+--------------+  FV Prox  Full                                                        +---------+---------------+---------+-----------+----------+--------------+ FV Mid   Full           Yes      Yes                                 +---------+---------------+---------+-----------+----------+--------------+ FV DistalFull           Yes      Yes                                 +---------+---------------+---------+-----------+----------+--------------+ PFV      Full                                                        +---------+---------------+---------+-----------+----------+--------------+ POP      Full           Yes      Yes                                 +---------+---------------+---------+-----------+----------+--------------+ PTV      Full                                                        +---------+---------------+---------+-----------+----------+--------------+ PERO     Full                                                        +---------+---------------+---------+-----------+----------+--------------+     Summary: RIGHT: - There is no evidence of deep vein thrombosis in the lower extremity.  - No cystic structure found in the popliteal fossa.  LEFT: - There is no evidence of deep vein thrombosis in the lower extremity.  - No cystic structure found in the popliteal fossa.  *See table(s) above for measurements and observations. Electronically signed by Gretta Began MD on 10/02/2019 at 5:02:07 PM.    Final     Scheduled Meds: .  enoxaparin (LOVENOX) injection  40 mg Subcutaneous BID  . methylPREDNISolone (SOLU-MEDROL) injection  60 mg Intravenous Q12H   Continuous Infusions: . remdesivir 100 mg in NS 100 mL 100 mg (10/03/19 1021)     LOS: 3 days   Time spent: 25 minutes.  Tyrone Nine, MD [facility] [email or web address] 10/03/2019, 2:52 PM

## 2019-10-04 LAB — CBC WITH DIFFERENTIAL/PLATELET
Abs Immature Granulocytes: 0.54 10*3/uL — ABNORMAL HIGH (ref 0.00–0.07)
Basophils Absolute: 0.1 10*3/uL (ref 0.0–0.1)
Basophils Relative: 0 %
Eosinophils Absolute: 0 10*3/uL (ref 0.0–0.5)
Eosinophils Relative: 0 %
HCT: 35.5 % — ABNORMAL LOW (ref 36.0–46.0)
Hemoglobin: 11.6 g/dL — ABNORMAL LOW (ref 12.0–15.0)
Immature Granulocytes: 4 %
Lymphocytes Relative: 10 %
Lymphs Abs: 1.5 10*3/uL (ref 0.7–4.0)
MCH: 24 pg — ABNORMAL LOW (ref 26.0–34.0)
MCHC: 32.7 g/dL (ref 30.0–36.0)
MCV: 73.3 fL — ABNORMAL LOW (ref 80.0–100.0)
Monocytes Absolute: 1.2 10*3/uL — ABNORMAL HIGH (ref 0.1–1.0)
Monocytes Relative: 9 %
Neutro Abs: 11.2 10*3/uL — ABNORMAL HIGH (ref 1.7–7.7)
Neutrophils Relative %: 77 %
Platelets: 562 10*3/uL — ABNORMAL HIGH (ref 150–400)
RBC: 4.84 MIL/uL (ref 3.87–5.11)
RDW: 18.5 % — ABNORMAL HIGH (ref 11.5–15.5)
WBC: 14.5 10*3/uL — ABNORMAL HIGH (ref 4.0–10.5)
nRBC: 0.1 % (ref 0.0–0.2)

## 2019-10-04 LAB — COMPREHENSIVE METABOLIC PANEL
ALT: 28 U/L (ref 0–44)
AST: 26 U/L (ref 15–41)
Albumin: 3.3 g/dL — ABNORMAL LOW (ref 3.5–5.0)
Alkaline Phosphatase: 46 U/L (ref 38–126)
Anion gap: 13 (ref 5–15)
BUN: 14 mg/dL (ref 6–20)
CO2: 22 mmol/L (ref 22–32)
Calcium: 8.8 mg/dL — ABNORMAL LOW (ref 8.9–10.3)
Chloride: 106 mmol/L (ref 98–111)
Creatinine, Ser: 0.88 mg/dL (ref 0.44–1.00)
GFR calc Af Amer: >60 mL/min (ref >60)
GFR calc non Af Amer: >60 mL/min (ref >60)
Glucose, Bld: 139 mg/dL — ABNORMAL HIGH (ref 70–99)
Potassium: 4.1 mmol/L (ref 3.5–5.1)
Sodium: 141 mmol/L (ref 135–145)
Total Bilirubin: 0.5 mg/dL (ref 0.3–1.2)
Total Protein: 7.3 g/dL (ref 6.5–8.1)

## 2019-10-04 LAB — C-REACTIVE PROTEIN: CRP: 1.1 mg/dL — ABNORMAL HIGH (ref <1.0)

## 2019-10-04 LAB — D-DIMER, QUANTITATIVE: D-Dimer, Quant: 8.96 ug/mL-FEU — ABNORMAL HIGH (ref 0.00–0.50)

## 2019-10-04 MED ORDER — ASPIRIN EC 81 MG PO TBEC
81.0000 mg | DELAYED_RELEASE_TABLET | Freq: Every day | ORAL | 0 refills | Status: AC
Start: 2019-10-04 — End: 2019-10-18

## 2019-10-04 MED ORDER — DEXAMETHASONE 6 MG PO TABS: 6.0000 mg | ORAL_TABLET | Freq: Every day | ORAL | 0 refills | Status: AC

## 2019-10-04 NOTE — Discharge Summary (Signed)
Physician Discharge Summary  Shaughnessy Gethers NWG:[id number] DOB: March 18, 2000 DOA: 09/30/2019  PCP: Patient, No Pcp Per  Admit date: 09/30/2019 Discharge date: 10/04/2019  Admitted From: Home Disposition: Home   Recommendations for Outpatient Follow-up:  1. Follow up with PCP in 1-2 weeks. Urged to get vaccine after isolation period ends.   Home Health: None Equipment/Devices: None Discharge Condition: Stable CODE STATUS: Full Diet recommendation: Regular  Brief/Interim Summary: Brianna Bender is a 19 y.o.femalewith medical history significant ofsickle cell trait, asthma, recent pregnancy who presented with 11 day h/o COVID symptoms having tested positive on 8/12. Developed CP and SOB since then. Was called on 8/12 for possible MAB treatment according NP telephone note, but they were not able to get hold of pt and left voicemail instead. In ED she required 1L O2.  CXR with the typical bilateral covid PNA findings. CRP 9.4. D.Dimer 4.5, Procalcitonin neg and WBC nl. She was admitted, given a full course of remdesivir, lovenox for VTE ppx, and steroids with resolution of hypoxia, improvement in symptoms overall. D-dimer has trended down, but remains elevated in absence of any clinical evidence of blood clots (including neg LE U/S and CTA chest). She is stable for discharge.  Discharge Diagnoses:  Active Problems:   Acute hypoxemic respiratory failure due to COVID-19 [facility])  Acute hypoxemic respiratory failure due to covid-19 pneumonia, asthma exacerbation: SARS-CoV-2 PCR positive on 8/12. Not vaccinated. PCT neg.  - Wean to room air if able over next 24 hours in hopes of discharging after 5th dose of remdesivir.  - Completed remdesivir x5 days (8/16 - 8/20) prior to discharge - Continue steroids x10 days. No current wheezing. Continued diminution of CRP noted, nearly normal on day of discharge.  - Continue isolation for 21 days from positive testing discussed with patient. - D-dimer coming down  but does remain elevated. Will Rx aspirin 81mg  po daily for duration of isolation period, encourage to stay active. Pt no longer pregnant and not breastfeeding. No clinical evidence of VTE. LE venous U/S showed no DVT, CTA chest showed no PE. Given prophylactic anticoagulation while admitted. Return precautions reviewed with patient at discharge.    Sickle cell trait: ?if d-dimer rising related to subclinical sickling due to acute illness vs. true thrombotic byproducts.   Morbid obesity: Estimated body mass index is 34.9 kg/m as calculated from the following:   Height as of this encounter: 5\' 3"  (1.6 m).   Weight as of this encounter: 89.4 kg.  - Worsens prognosis despite young age.   Discharge Instructions Discharge Instructions    Discharge instructions   Complete by: As directed    You are being discharged from the hospital after treatment for covid-19 infection. You are felt to be stable enough to no longer require inpatient monitoring, testing, and treatment, though you will need to follow the recommendations below: - Continue taking decadron 6mg  daily, a steroid, starting tomorrow for 5 more days.  - Take aspirin 81mg  daily for 14 more days to reduce your risk of blood clotting. You should also remain active, not staying in bed as able. If you notice bleeding/bruising, leg swelling/pain, worsening shortness of breath or chest pain, seek medical attention right away.  - Per CDC guidelines, you will need to remain in isolation for 21 days from your first positive covid test. - Follow up with your doctor in the next week via telehealth or seek medical attention right away if your symptoms get WORSE.  - You are still encouraged to get  a covid vaccination between 21 days (after isolation period ends) and 90 days (before immunity is thought to wear off).  Directions for you at home:  Wear a facemask You should wear a facemask that covers your nose and mouth when you are in the same room with  other people and when you visit a healthcare provider. People who live with or visit you should also wear a facemask while they are in the same room with you.  Separate yourself from other people in your home As much as possible, you should stay in a different room from other people in your home. Also, you should use a separate bathroom, if available.  Avoid sharing household items You should not share dishes, drinking glasses, cups, eating utensils, towels, bedding, or other items with other people in your home. After using these items, you should wash them thoroughly with soap and water.  Cover your coughs and sneezes Cover your mouth and nose with a tissue when you cough or sneeze, or you can cough or sneeze into your sleeve. Throw used tissues in a lined trash can, and immediately wash your hands with soap and water for at least 20 seconds or use an alcohol-based hand rub.  Wash your [employer] your hands often and thoroughly with soap and water for at least 20 seconds. You can use an alcohol-based hand sanitizer if soap and water are not available and if your hands are not visibly dirty. Avoid touching your eyes, nose, and mouth with unwashed hands.  Directions for those who live with, or provide care at home for you:  Limit the number of people who have contact with the patient If possible, have only one caregiver for the patient. Other household members should stay in another home or place of residence. If this is not possible, they should stay in another room, or be separated from the patient as much as possible. Use a separate bathroom, if available. Restrict visitors who do not have an essential need to be in the home.  Ensure good ventilation Make sure that shared spaces in the home have good air flow, such as from an air conditioner or an opened window, weather permitting.  Wash your hands often Wash your hands often and thoroughly with soap and water for at least 20  seconds. You can use an alcohol based hand sanitizer if soap and water are not available and if your hands are not visibly dirty. Avoid touching your eyes, nose, and mouth with unwashed hands. Use disposable paper towels to dry your hands. If not available, use dedicated cloth towels and replace them when they become wet.  Wear a facemask and gloves Wear a disposable facemask at all times in the room and gloves when you touch or have contact with the patient's blood, body fluids, and/or secretions or excretions, such as sweat, saliva, sputum, nasal mucus, vomit, urine, or feces.  Ensure the mask fits over your nose and mouth tightly, and do not touch it during use. Throw out disposable facemasks and gloves after using them. Do not reuse. Wash your hands immediately after removing your facemask and gloves. If your personal clothing becomes contaminated, carefully remove clothing and launder. Wash your hands after handling contaminated clothing. Place all used disposable facemasks, gloves, and other waste in a lined container before disposing them with other household waste. Remove gloves and wash your hands immediately after handling these items.  Do not share dishes, glasses, or other household items with the patient  Avoid sharing household items. You should not share dishes, drinking glasses, cups, eating utensils, towels, bedding, or other items with a patient who is confirmed to have, or being evaluated for, COVID-19 infection. After the person uses these items, you should wash them thoroughly with soap and water.  Wash laundry thoroughly Immediately remove and wash clothes or bedding that have blood, body fluids, and/or secretions or excretions, such as sweat, saliva, sputum, nasal mucus, vomit, urine, or feces, on them. Wear gloves when handling laundry from the patient. Read and follow directions on labels of laundry or clothing items and detergent. In general, wash and dry with the warmest  temperatures recommended on the label.  Clean all areas the individual has used often Clean all touchable surfaces, such as counters, tabletops, doorknobs, bathroom fixtures, toilets, phones, keyboards, tablets, and bedside tables, every day. Also, clean any surfaces that may have blood, body fluids, and/or secretions or excretions on them. Wear gloves when cleaning surfaces the patient has come in contact with. Use a diluted bleach solution (e.g., dilute bleach with 1 part bleach and 10 parts water) or a household disinfectant with a label that says EPA-registered for coronaviruses. To make a bleach solution at home, add 1 tablespoon of bleach to 1 quart (4 cups) of water. For a larger supply, add  cup of bleach to 1 gallon (16 cups) of water. Read labels of cleaning products and follow recommendations provided on product labels. Labels contain instructions for safe and effective use of the cleaning product including precautions you should take when applying the product, such as wearing gloves or eye protection and making sure you have good ventilation during use of the product. Remove gloves and wash hands immediately after cleaning.  Monitor yourself for signs and symptoms of illness Caregivers and household members are considered close contacts, should monitor their health, and will be asked to limit movement outside of the home to the extent possible. Follow the monitoring steps for close contacts listed on the symptom monitoring form.  If you have additional questions, contact your local health department or call the epidemiologist on call at [phone] (available 24/7). This guidance is subject to change. For the most up-to-date guidance from [facility], please refer to their website: [email or web address]   MyChart COVID-19 home monitoring program   Complete by: Oct 04, 2019    Is the patient willing to use the MyChart Mobile App for home  monitoring?: Yes     Allergies as of 10/04/2019   No Known Allergies     Medication List    STOP taking these medications   amLODipine 10 MG tablet Commonly known as: NORVASC   butalbital-acetaminophen-caffeine 50-325-40 MG tablet Commonly known as: FIORICET   cyclobenzaprine 10 MG tablet Commonly known as: FLEXERIL   ibuprofen 600 MG tablet Commonly known as: ADVIL   norethindrone 0.35 MG tablet Commonly known as: MICRONOR   Prenatal Gummies/DHA & FA 0.4-32.5 MG Chew     TAKE these medications   acetaminophen 325 MG tablet Commonly known as: Tylenol Take 2 tablets (650 mg total) by mouth every 6 (six) hours as needed (for pain scale < 4).   albuterol 108 (90 Base) MCG/ACT inhaler Commonly known as: VENTOLIN HFA Inhale 1-2 puffs into the lungs every 6 (six) hours as needed for wheezing or shortness of breath.   aspirin EC 81 MG tablet Take 1 tablet (81 mg total) by mouth daily for 14 days. Swallow whole.   dexamethasone 6 MG tablet Commonly known as: Decadron  Take 1 tablet (6 mg total) by mouth daily for 5 days.   ferrous sulfate 325 (65 FE) MG tablet Take 1 tablet (325 mg total) by mouth every other day.   guaifenesin 100 MG/5ML syrup Commonly known as: ROBITUSSIN Take 200 mg by mouth 3 (three) times daily as needed for cough.       Follow-up Information    [facility]. Schedule an appointment as soon as possible for a visit in 3 week(s).   Contact information: [address] [place] [address] [phone]             No Known Allergies  Consultations:  None  Procedures/Studies: DG Chest 2 View  Result Date: 09/26/2019 CLINICAL DATA:  Shortness of breath for 1 week. History of sickle cell disease. EXAM: CHEST - 2 VIEW COMPARISON:  PA and lateral chest 11/08/2015. FINDINGS: The patient has right basilar and left mid lung airspace disease consistent with pneumonia. No pneumothorax or pleural  effusion. Heart size is normal. No acute or focal bony abnormality. IMPRESSION: Right basilar and left midlung airspace disease consistent with pneumonia. Atypical/viral infection is possible. Electronically Signed   By: Drusilla Kanner M.D.   On: 09/26/2019 15:08   CT Angio Chest PE W/Cm &/Or Wo Cm  Result Date: 09/30/2019 CLINICAL DATA:  Shortness of breath, COVID-19 positive EXAM: CT ANGIOGRAPHY CHEST WITH CONTRAST TECHNIQUE: Multidetector CT imaging of the chest was performed using the standard protocol during bolus administration of intravenous contrast. Multiplanar CT image reconstructions and MIPs were obtained to evaluate the vascular anatomy. CONTRAST:  OMNIPAQUE IOHEXOL 350 MG/ML SOLN COMPARISON:  Radiograph 09/30/2019 FINDINGS: Cardiovascular: Satisfactory opacification of the pulmonary arteries. No large central or lobar pulmonary artery emboli. Respiratory motion significantly limits detection of smaller, subsegmental pulmonary emboli. Central pulmonary arteries are normal caliber. Normal heart size. No pericardial effusion. The aortic root is suboptimally assessed given cardiac pulsation artifact. No gross aortic abnormality. Proximal great vessels are unremarkable. Mediastinum/Nodes: Low-attenuation subcentimeter mediastinal and hilar lymph nodes are present, favored to be reactive. No worrisome enlarged nodes within the included lymphatic chains. No acute abnormality of the trachea or esophagus. Thyroid gland and thoracic inlet are unremarkable. Lungs/Pleura: Multifocal areas of mixed ground-glass and consolidation throughout both lungs with additional more bandlike areas of opacity likely reflecting subsegmental atelectasis and/or developing scarring. No pneumothorax or effusion. Upper Abdomen: No acute abnormalities present in the visualized portions of the upper abdomen. Musculoskeletal: No chest wall mass or suspicious bone lesions identified. Review of the MIP images confirms the  above findings. IMPRESSION: 1. Satisfactory opacification of the pulmonary arteries. No large central or lobar pulmonary artery emboli. Respiratory motion significantly limits detection of smaller, subsegmental pulmonary emboli. 2. Multifocal areas of mixed ground-glass and consolidation throughout both lungs compatible with multifocal infection in the setting of COVID-19. Additional bandlike opacities may reflect subsegmental atelectatic change and/or scarring. Electronically Signed   By: Kreg Shropshire M.D.   On: 09/30/2019 22:09   DG Chest Port 1 View  Result Date: 09/30/2019 CLINICAL DATA:  COVID 19 positive, discharged 09/26/2019, worsening chest pain and dyspnea EXAM: PORTABLE CHEST 1 VIEW COMPARISON:  09/26/2019 chest radiograph. FINDINGS: Stable cardiomediastinal silhouette with normal heart size. No pneumothorax. No pleural effusion. Extensive patchy opacities in the mid to lower lungs bilaterally, slightly worsened. IMPRESSION: Slight worsening of extensive patchy opacities in the mid to lower lungs bilaterally compatible with COVID-19 pneumonia. Electronically Signed   By: Jannifer Rodney.D.  On: 09/30/2019 19:00   VAS [place] LOWER EXTREMITY VENOUS (DVT)  Result Date: 10/02/2019  Lower Venous DVTStudy Indications: Elevated d-dimer.  Limitations: Poor ultrasound/tissue interface and body habitus. Comparison Study: No prior studies. Performing Technologist: Jean Rosenthal  Examination Guidelines: A complete evaluation includes B-mode imaging, spectral Doppler, color Doppler, and power Doppler as needed of all accessible portions of each vessel. Bilateral testing is considered an integral part of a complete examination. Limited examinations for reoccurring indications may be performed as noted. The reflux portion of the exam is performed with the patient in reverse Trendelenburg.  +---------+---------------+---------+-----------+----------+--------------+ RIGHT     CompressibilityPhasicitySpontaneityPropertiesThrombus Aging +---------+---------------+---------+-----------+----------+--------------+ CFV      Full           Yes      Yes                                 +---------+---------------+---------+-----------+----------+--------------+ SFJ      Full                                                        +---------+---------------+---------+-----------+----------+--------------+ FV Prox  Full                                                        +---------+---------------+---------+-----------+----------+--------------+ FV Mid   Full           Yes      Yes                                 +---------+---------------+---------+-----------+----------+--------------+ FV DistalFull           Yes      Yes                                 +---------+---------------+---------+-----------+----------+--------------+ PFV      Full                                                        +---------+---------------+---------+-----------+----------+--------------+ POP      Full           Yes      Yes                                 +---------+---------------+---------+-----------+----------+--------------+ PTV      Full                                                        +---------+---------------+---------+-----------+----------+--------------+ PERO     Full                                                        +---------+---------------+---------+-----------+----------+--------------+   +---------+---------------+---------+-----------+----------+--------------+  LEFT     CompressibilityPhasicitySpontaneityPropertiesThrombus Aging +---------+---------------+---------+-----------+----------+--------------+ CFV      Full           Yes      Yes                                 +---------+---------------+---------+-----------+----------+--------------+ SFJ      Full                                                         +---------+---------------+---------+-----------+----------+--------------+ FV Prox  Full                                                        +---------+---------------+---------+-----------+----------+--------------+ FV Mid   Full           Yes      Yes                                 +---------+---------------+---------+-----------+----------+--------------+ FV DistalFull           Yes      Yes                                 +---------+---------------+---------+-----------+----------+--------------+ PFV      Full                                                        +---------+---------------+---------+-----------+----------+--------------+ POP      Full           Yes      Yes                                 +---------+---------------+---------+-----------+----------+--------------+ PTV      Full                                                        +---------+---------------+---------+-----------+----------+--------------+ PERO     Full                                                        +---------+---------------+---------+-----------+----------+--------------+     Summary: RIGHT: - There is no evidence of deep vein thrombosis in the lower extremity.  - No cystic structure found in the popliteal fossa.  LEFT: - There is no evidence of deep vein thrombosis in the lower extremity.  - No cystic structure found in the popliteal fossa.  *See table(s) above for  measurements and observations. Electronically signed by Gretta Began MD on 10/02/2019 at 5:02:07 PM.    Final    Subjective: No shortness of breath, chest pain, leg swelling or other complaints. Feels she's doing better. Has been in the ED for entirety of admission and is due for final dose of remdesivir this morning. She feels ok to go home afterwards.   Discharge Exam: Vitals:   10/04/19 0715 10/04/19 0818  BP: 107/81 113/82  Pulse: (!) 57 70  Resp: 20 20  Temp:  98.4 F (36.9  C)  SpO2: 94% 100%   General: Pt is alert, awake, not in acute distress Cardiovascular: RRR, S1/S2 +, no rubs, no gallops Respiratory: CTA bilaterally, no wheezing, no rhonchi Abdominal: Soft, NT, ND, bowel sounds + Extremities: No edema, no cyanosis  Labs: BNP (last 3 results) No results for input(s): BNP in the last 8760 hours. Basic Metabolic Panel: Recent Labs  Lab 09/30/19 1847 10/01/19 0510 10/02/19 0518 10/03/19 0634 10/04/19 0159  NA 143 140 140 141 141  K 4.2 5.0 4.6 4.2 4.1  CL 104 106 105 106 106  CO2 26 26 24 27 22   GLUCOSE 95 133* 122* 106* 139*  BUN 13 13 13 18 14   CREATININE 0.94 0.83 0.77 0.86 0.88  CALCIUM 9.1 9.2 8.7* 8.9 8.8*   Liver Function Tests: Recent Labs  Lab 09/30/19 1847 10/01/19 0510 10/02/19 0518 10/03/19 0634 10/04/19 0159  AST 37 28 22 18 26   ALT 37 35 26 26 28   ALKPHOS 46 45 39 40 46  BILITOT 0.7 0.1* 0.6 0.4 0.5  PROT 8.1 8.1 6.7 6.9 7.3  ALBUMIN 3.4* 3.3* 2.9* 3.1* 3.3*   No results for input(s): LIPASE, AMYLASE in the last 168 hours. No results for input(s): AMMONIA in the last 168 hours. CBC: Recent Labs  Lab 09/30/19 1847 10/01/19 0510 10/02/19 0518 10/03/19 0634 10/04/19 0159  WBC 6.3 4.1 7.5 10.1 14.5*  NEUTROABS 4.6 3.1 5.5 7.2 11.2*  HGB 12.5 11.7* 10.9* 11.4* 11.6*  HCT 38.7 35.3* 33.1* 35.6* 35.5*  MCV 73.3* 72.3* 72.7* 73.3* 73.3*  PLT 546* 551* 514* 558* 562*   Cardiac Enzymes: No results for input(s): CKTOTAL, CKMB, CKMBINDEX, TROPONINI in the last 168 hours. BNP: Invalid input(s): POCBNP CBG: No results for input(s): GLUCAP in the last 168 hours. D-Dimer Recent Labs    10/03/19 0634 10/04/19 0159  DDIMER 13.51* 8.96*   Hgb A1c No results for input(s): HGBA1C in the last 72 hours. Lipid Profile No results for input(s): CHOL, HDL, LDLCALC, TRIG, CHOLHDL, LDLDIRECT in the last 72 hours. Thyroid function studies No results for input(s): TSH, T4TOTAL, T3FREE, THYROIDAB in the last 72  hours.  Invalid input(s): FREET3 Anemia work up No results for input(s): VITAMINB12, FOLATE, FERRITIN, TIBC, IRON, RETICCTPCT in the last 72 hours. Urinalysis    Component Value Date/Time   COLORURINE YELLOW 06/06/2019 2135   APPEARANCEUR HAZY (A) 06/06/2019 2135   LABSPEC 1.010 06/06/2019 2135   PHURINE 7.0 06/06/2019 2135   GLUCOSEU 50 (A) 06/06/2019 2135   HGBUR MODERATE (A) 06/06/2019 2135   BILIRUBINUR NEGATIVE 06/06/2019 2135   KETONESUR NEGATIVE 06/06/2019 2135   PROTEINUR NEGATIVE 06/06/2019 2135   NITRITE NEGATIVE 06/06/2019 2135   LEUKOCYTESUR SMALL (A) 06/06/2019 2135    Microbiology Recent Results (from the past 240 hour(s))  SARS Coronavirus 2 by RT PCR (hospital order, performed in [facility] hospital lab) Nasopharyngeal Nasopharyngeal Swab     Status: Abnormal   Collection Time: 09/26/19  4:37 PM   Specimen: Nasopharyngeal Swab  Result Value Ref Range Status   SARS Coronavirus 2 POSITIVE (A) NEGATIVE Final    Comment: RESULT CALLED TO, READ BACK BY AND VERIFIED WITH: C.HALL AT 1801 ON 09/26/19 BY N.THOMPSON (NOTE) SARS-CoV-2 target nucleic acids are DETECTED  SARS-CoV-2 RNA is generally detectable in upper respiratory specimens  during the acute phase of infection.  Positive results are indicative  of the presence of the identified virus, but do not rule out bacterial infection or co-infection with other pathogens not detected by the test.  Clinical correlation with patient history and  other diagnostic information is necessary to determine patient infection status.  The expected result is negative.  Fact Sheet for Patients:   BoilerBrush.com.cyhttps://www.fda.gov/media/136312/download   Fact Sheet for Healthcare Providers:   https://pope.com/https://www.fda.gov/media/136313/download    This test is not yet approved or cleared by the [place] FDA and  has been authorized for detection and/or diagnosis of SARS-CoV-2 by FDA under an Emergency Use Authorization (EUA).  This EUA  will remain in effect (meaning th is test can be used) for the duration of  the COVID-19 declaration under Section 564(b)(1) of the Act, 21 U.S.C. section 360-bbb-3(b)(1), unless the authorization is terminated or revoked sooner.  Performed at [facility], [address] [place], [place] [address]   Blood Culture (routine x 2)     Status: None (Preliminary result)   Collection Time: 09/30/19  6:33 PM   Specimen: BLOOD  Result Value Ref Range Status   Specimen Description   Final    BLOOD LEFT ANTECUBITAL Performed at [facility], [address] [place], [place] [address]    Special Requests   Final    BOTTLES DRAWN AEROBIC AND ANAEROBIC Blood Culture adequate volume Performed at [facility], [address] [place], [place] [id number]    Culture   Final    NO GROWTH 4 DAYS Performed at [facility], [address] [place], [place] [address]    Report Status PENDING  Incomplete  Blood Culture (routine x 2)     Status: None (Preliminary result)   Collection Time: 09/30/19  6:46 PM   Specimen: Site Not Specified; Blood  Result Value Ref Range Status   Specimen Description   Final    SITE NOT SPECIFIED Performed at [facility], [address] [place], [place] [address]    Special Requests   Final    BOTTLES DRAWN AEROBIC AND ANAEROBIC Blood Culture results may not be optimal due to an inadequate volume of blood received in culture bottles Performed at [facility], [address] [place], [place] [address]    Culture   Final    NO GROWTH 4 DAYS Performed at [facility], [address] [place], [place] [address]    Report Status PENDING  Incomplete    Time coordinating discharge: Approximately 40 minutes  Tyrone Nineyan B Caryl Manas, MD  [facility] 10/04/2019, 8:23 AM

## 2019-10-05 LAB — CULTURE, BLOOD (ROUTINE X 2)
Culture: NO GROWTH
Culture: NO GROWTH
Special Requests: ADEQUATE

## 2019-10-10 ENCOUNTER — Ambulatory Visit: Payer: Medicaid Other

## 2020-01-08 ENCOUNTER — Inpatient Hospital Stay (HOSPITAL_COMMUNITY)
Admission: AD | Admit: 2020-01-08 | Discharge: 2020-01-08 | Disposition: A | Discharge status: Home / Self Care | Payer: Medicaid Other | Attending: Obstetrics and Gynecology | Admitting: Obstetrics and Gynecology

## 2020-01-08 ENCOUNTER — Encounter (HOSPITAL_COMMUNITY): Payer: Self-pay | Admitting: Obstetrics and Gynecology

## 2020-01-08 ENCOUNTER — Other Ambulatory Visit: Payer: Self-pay

## 2020-01-08 DIAGNOSIS — Z3A12 12 weeks gestation of pregnancy: Secondary | ICD-10-CM | POA: Diagnosis not present

## 2020-01-08 DIAGNOSIS — O26891 Other specified pregnancy related conditions, first trimester: Secondary | ICD-10-CM | POA: Diagnosis not present

## 2020-01-08 DIAGNOSIS — O26899 Other specified pregnancy related conditions, unspecified trimester: Secondary | ICD-10-CM

## 2020-01-08 DIAGNOSIS — Z3201 Encounter for pregnancy test, result positive: Secondary | ICD-10-CM | POA: Diagnosis not present

## 2020-01-08 DIAGNOSIS — R109 Unspecified abdominal pain: Secondary | ICD-10-CM | POA: Diagnosis not present

## 2020-01-08 LAB — POCT PREGNANCY, URINE: Preg Test, Ur: POSITIVE — AB

## 2020-01-08 NOTE — MAU Note (Signed)
Patient sent from pregnancy care center for abdominal cramping in pregnancy.  States she had some bleeding 2 weeks ago but none current.  States she pain comes and goes.  LMP 10/23/2019.

## 2020-01-08 NOTE — MAU Provider Note (Signed)
First Provider Initiated Contact with Patient 01/08/20 1319      S [name] is a 19 y.o. G3P1011 patient who presents to MAU today with complaint of none. Patient was sent over by pregnancy care network for ectopic workup as patient told them she was [redacted] weeks pregnant and was having abdominal pain and had bleeding 3 days ago. Patient then reports to MAU RN that she is here for an [place] to learn the gender of the baby. Patient also discloses that she knew she was farther along than she told the [facility] she was, but she knew they would not give her an [place] if she told them exactly how far along she was. Patient also reports her bleeding was 2 weeks ago and denies pain at this time.  O BP 128/85   Pulse 72   Temp 98.6 F (37 C)   Resp 19   LMP 10/23/2019    Physical Exam Vitals and nursing note reviewed.  Constitutional:      General: She is not in acute distress.    Appearance: Normal appearance. She is not ill-appearing, toxic-appearing or diaphoretic.  HENT:     Head: Normocephalic and atraumatic.  Pulmonary:     Effort: Pulmonary effort is normal.  Neurological:     Mental Status: She is alert and oriented to person, place, and time.  Psychiatric:        Mood and Affect: Mood normal.        Behavior: Behavior normal.        Thought Content: Thought content normal.        Judgment: Judgment normal.    A Medical screening exam complete RH Positive FHT 153  P Discharge from MAU in stable condition List of OB Providers given List of safe meds in pregnancy given Warning signs for worsening condition that would warrant emergency follow-up discussed Patient may return to MAU as needed   Romonia Yanik, Odie Sera, NP 01/08/2020 2:03 PM

## 2020-01-08 NOTE — Discharge Instructions (Signed)
Prenatal Care Providers ? ?         ?[facility] - accepts patients without insurance ? Phone: [phone] ? ?[facility] @ [facility]  ? Phone: [phone] ? ?[facility]      ? Phone: [phone]   ?         ?[facility] @ [facility]    ? Phone: [phone] ?         ?[facility]  ? Phone: [phone] ? ?[facility] - accepts patients without insurance ? Phone: [phone] ? ?[facility] @ [facility] ?Phone: [phone] ?    ?[facility] - accepts patients without insurance ?Phone: [phone] ? ?[facility]  ?Phone: [phone] ? ?[facility] ?Phone: [phone] ? ?[facility] ?Phone: [phone] ? ?[facility] ?Phone: [phone] ? ?[facility] ?Phone: [phone] ? ?[facility]  ?Phone: [phone] ? ? ? ? ? ? ?                 Safe Medications in Pregnancy  ? ? ?Acne: ?Benzoyl Peroxide ?Salicylic Acid ? ?Backache/Headache: ?Tylenol: 2 regular strength every 4 hours OR ?             2 Extra strength every 6 hours ? ?Colds/Coughs/Allergies: ?Benadryl (alcohol free) 25 mg every 6 hours as needed ?Breath right strips ?Claritin ?Cepacol throat lozenges ?Chloraseptic throat spray ?Cold-Eeze- up to three times per day ?Cough drops, alcohol free ?Flonase (by prescription only) ?Guaifenesin ?Mucinex ?Robitussin DM (plain only, alcohol free) ?Saline nasal spray/drops ?Sudafed (pseudoephedrine) & Actifed ** use only after [redacted] weeks gestation and if you do not have high blood pressure ?Tylenol ?Vicks Vaporub ?Zinc lozenges ?Zyrtec  ? ?Constipation: ?Colace ?Ducolax suppositories ?Fleet enema ?Glycerin suppositories ?Metamucil ?Milk of magnesia ?Miralax ?Senokot ?Smooth move tea ? ?Diarrhea: ?Kaopectate ?Imodium A-D ? ?*NO pepto  Bismol ? ?Hemorrhoids: ?Anusol ?Anusol HC ?Preparation H ?Tucks ? ?Indigestion: ?Tums ?Maalox ?Mylanta ?Zantac  ?Pepcid ? ?Insomnia: ?Benadryl (alcohol free) 25mg every 6 hours as needed ?Tylenol PM ?Unisom, no Gelcaps ? ?Leg Cramps: ?Tums ?MagGel ? ?Nausea/Vomiting:  ?Bonine ?Dramamine ?Emetrol ?Ginger extract ?Sea bands ?Meclizine  ?Nausea medication to take during pregnancy:  ?Unisom (doxylamine succinate 25 mg tablets) Take one tablet daily at bedtime. If symptoms are not adequately controlled, the dose can be increased to a maximum recommended dose of two tablets daily (1/2 tablet in the morning, 1/2 tablet mid-afternoon and one at bedtime). ?Vitamin B6 100mg tablets. Take one tablet twice a day (up to 200 mg per day). ? ?Skin Rashes: ?Aveeno products ?Benadryl cream or 25mg every 6 hours as needed ?Calamine Lotion ?1% cortisone cream ? ?Yeast infection: ?Gyne-lotrimin 7 ?Monistat 7 ? ? ?**If taking multiple medications, please check labels to avoid duplicating the same active ingredients ?**take medication as directed on the label ?** Do not exceed 4000 mg of tylenol in 24 hours ?**Do not take medications that contain aspirin or ibuprofen ? ? ? ? ? ? ? ? ? ?

## 2020-02-02 ENCOUNTER — Encounter (HOSPITAL_COMMUNITY): Payer: Self-pay

## 2020-02-02 ENCOUNTER — Emergency Department (HOSPITAL_COMMUNITY)
Admission: EM | Admit: 2020-02-02 | Discharge: 2020-02-02 | Disposition: A | Discharge status: Home / Self Care | Payer: Medicaid Other | Attending: Emergency Medicine | Admitting: Emergency Medicine

## 2020-02-02 ENCOUNTER — Other Ambulatory Visit: Payer: Self-pay

## 2020-02-02 DIAGNOSIS — O9A212 Injury, poisoning and certain other consequences of external causes complicating pregnancy, second trimester: Secondary | ICD-10-CM | POA: Diagnosis not present

## 2020-02-02 DIAGNOSIS — Z3A17 17 weeks gestation of pregnancy: Secondary | ICD-10-CM | POA: Diagnosis not present

## 2020-02-02 DIAGNOSIS — Z3A14 14 weeks gestation of pregnancy: Secondary | ICD-10-CM | POA: Diagnosis not present

## 2020-02-02 DIAGNOSIS — O99891 Other specified diseases and conditions complicating pregnancy: Secondary | ICD-10-CM | POA: Diagnosis not present

## 2020-02-02 DIAGNOSIS — M25552 Pain in left hip: Secondary | ICD-10-CM | POA: Diagnosis not present

## 2020-02-02 DIAGNOSIS — M549 Dorsalgia, unspecified: Secondary | ICD-10-CM | POA: Diagnosis not present

## 2020-02-02 DIAGNOSIS — M533 Sacrococcygeal disorders, not elsewhere classified: Secondary | ICD-10-CM | POA: Diagnosis not present

## 2020-02-02 DIAGNOSIS — W19XXXA Unspecified fall, initial encounter: Secondary | ICD-10-CM

## 2020-02-02 NOTE — ED Notes (Signed)
ED Provider at bedside. 

## 2020-02-02 NOTE — ED Triage Notes (Signed)
Pt reports slipping and falling down 4 concrete stairs. Pt now endorses left hip/tailbone pain. Pt denies any other injuries. Pt is [redacted] weeks pregnant.

## 2020-02-02 NOTE — ED Provider Notes (Addendum)
[facility]-EMERGENCY DEPT Provider Note   CSN: [id number] Arrival date & time: 02/02/20  1225     History Chief Complaint  Patient presents with  . Fall    Brianna Bender is a 19 y.o. female.   Fall This is a new problem. The current episode started 1 to 2 hours ago. The problem occurs constantly. The problem has not changed since onset.Pertinent negatives include no chest pain, no abdominal pain, no headaches and no shortness of breath. Associated symptoms comments: Paraspinal pain on the left . Nothing aggravates the symptoms. Nothing relieves the symptoms. She has tried nothing for the symptoms. The treatment provided no relief.       Past Medical History:  Diagnosis Date  . Asthma   . Sickle cell trait (HCC) 02/25/2019   QTrimester urine culture - 2nd trimester negative.    [ ]  3rd tri Given genetic counselor info to discuss FOB testing    Patient Active Problem List   Diagnosis Date Noted  . Acute hypoxemic respiratory failure due to COVID-19 (HCC) 09/30/2019  . Anemia 05/11/2019  . Gestational hypertension 05/09/2019  . Asthma affecting pregnancy in third trimester 04/17/2019  . Frequent headaches 04/03/2019  . Sickle cell trait (HCC) 02/25/2019  . Late prenatal care affecting pregnancy in second trimester 02/24/2019  . Supervision of low-risk pregnancy 02/12/2019  . 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 Suicidal ideation 11/18/2016  . Asthma exacerbation 05/06/2013    Past Surgical History:  Procedure Laterality Date  . WISDOM TOOTH EXTRACTION       OB History    Gravida  3   Para  1   Term  1   Preterm  0   AB  1   Living  1     SAB  0   IAB  1   Ectopic  0   Multiple  0   Live Births  1           Family History  Problem Relation Age of Onset  . Healthy Mother   . Healthy Father     Social History   Tobacco Use  . Smoking status: Never Smoker  .  Smokeless tobacco: Never Used  Vaping Use  . Vaping Use: Never used  Substance Use Topics  . Alcohol use: Not Currently  . Drug use: Not Currently    Types: Marijuana    Home Medications Prior to Admission medications   Medication Sig Start Date End Date Taking? Authorizing Provider  acetaminophen (TYLENOL) 325 MG tablet Take 2 tablets (650 mg total) by mouth every 6 (six) hours as needed (for pain scale < 4). 05/12/19   Fair, 05/14/19, MD  albuterol (VENTOLIN HFA) 108 (90 Base) MCG/ACT inhaler Inhale 1-2 puffs into the lungs every 6 (six) hours as needed for wheezing or shortness of breath. 04/17/19   06/17/19, MD  ferrous sulfate 325 (65 FE) MG tablet Take 1 tablet (325 mg total) by mouth every other day. Patient not taking: Reported on 06/06/2019 05/12/19   05/14/19, MD  guaifenesin (ROBITUSSIN) 100 MG/5ML syrup Take 200 mg by mouth 3 (three) times daily as needed for cough.    [provider]    Allergies    Patient has no known allergies.  Review of Systems   Review of Systems  Constitutional: Negative for chills and fever.  HENT: Negative for congestion and rhinorrhea.   Respiratory: Negative for cough and  shortness of breath.   Cardiovascular: Negative for chest pain and palpitations.  Gastrointestinal: Negative for abdominal pain, diarrhea, nausea and vomiting.  Genitourinary: Negative for difficulty urinating, dysuria, menstrual problem, pelvic pain and vaginal bleeding.  Musculoskeletal: Positive for arthralgias and back pain.  Skin: Negative for rash and wound.  Neurological: Negative for light-headedness and headaches.    Physical Exam Updated Vital Signs BP 128/61   Pulse 68   Temp 98.2 F (36.8 C) (Oral)   Resp 16   Ht 5\' 2"  (1.575 m)   Wt 84.8 kg   LMP 10/23/2019   SpO2 99%   BMI 34.20 kg/m   Physical Exam Vitals and nursing note reviewed. Exam conducted with a chaperone present.  Constitutional:      General: She is not in acute  distress.    Appearance: Normal appearance.  HENT:     Head: Normocephalic and atraumatic.     Nose: No rhinorrhea.  Eyes:     General:        Right eye: No discharge.        Left eye: No discharge.     Conjunctiva/sclera: Conjunctivae normal.  Cardiovascular:     Rate and Rhythm: Normal rate and regular rhythm.  Pulmonary:     Effort: Pulmonary effort is normal. No respiratory distress.     Breath sounds: No stridor.  Abdominal:     General: Abdomen is flat. There is no distension.     Palpations: Abdomen is soft.     Tenderness: There is no abdominal tenderness.  Musculoskeletal:        General: No swelling, tenderness, deformity or signs of injury.     Comments: No midline bony tenderness, full range of motion with full upper and lower extremities  Skin:    General: Skin is warm and dry.  Neurological:     General: No focal deficit present.     Mental Status: She is alert. Mental status is at baseline.     Motor: No weakness.     Comments: Five and five motor strength sensation lower extremities, able to ambulate without ataxia  Psychiatric:        Mood and Affect: Mood normal.        Behavior: Behavior normal.     ED Results / Procedures / Treatments   Labs (all labs ordered are listed, but only abnormal results are displayed) Labs Reviewed - No data to display  EKG None  Radiology No results found.  Procedures Procedures (including critical care time)  Medications Ordered in ED Medications - No data to display  ED Course  I have reviewed the triage vital signs and the nursing notes.  Pertinent labs & imaging results that were available during my care of the patient were reviewed by me and considered in my medical decision making (see chart for details).    MDM Rules/Calculators/A&P                          Fall from standing landed on buttocks, [redacted] weeks pregnant, no loss of fluids no bleeding no menstrual cramping, patient does not feel baby move yet  but has not.  We will attempt to get fetal heart tones.  Abdomen soft nontender no signs of bony trauma, patient is able to ambulate.  I spoke with the MAU provider on-call, they recommended attempting a fetal heart tones, but because the patient is not a viable age there is not much  to do other than reassurance.  Counseled the family on outpatient follow-up with MAU as needed.  Doppler fetal heart tones at 135 by bedside nursing.  Patient will be discharged home with strict return precautions provided outpatient follow-up recommended  As for the back pain related to the fall.  She has no focal midline spinal tenderness, she has a little bit of paraspinal muscle tenderness a little tenderness to the sacroiliac joint but she is able to bear weight she is able to range her joints fully she has no neurologic dysfunction.  Likelihood of finding intervening bowel pathology on x-ray is low, we discussed the risk benefit of radiating her pelvis while she is pregnant versus just observing and see how the pain does over the next few days, she agrees no new imaging is needed at this time.  Final Clinical Impression(s) / ED Diagnoses Final diagnoses:  Fall, initial encounter    Rx / DC Orders ED Discharge Orders    None       Sabino Donovan, MD 02/02/20 1404    Sabino Donovan, MD 02/02/20 [phone]

## 2020-02-02 NOTE — Discharge Instructions (Signed)
[facility] [address] [place],  [place]  [address]  Phone number: [phone]  

## 2020-04-02 DIAGNOSIS — H538 Other visual disturbances: Secondary | ICD-10-CM | POA: Diagnosis not present

## 2020-04-03 DIAGNOSIS — H5213 Myopia, bilateral: Secondary | ICD-10-CM | POA: Diagnosis not present

## 2020-05-13 DIAGNOSIS — H52223 Regular astigmatism, bilateral: Secondary | ICD-10-CM | POA: Diagnosis not present

## 2020-05-13 DIAGNOSIS — H5203 Hypermetropia, bilateral: Secondary | ICD-10-CM | POA: Diagnosis not present

## 2020-09-05 ENCOUNTER — Other Ambulatory Visit: Payer: Self-pay

## 2020-09-05 ENCOUNTER — Encounter (HOSPITAL_COMMUNITY): Payer: Self-pay | Admitting: Emergency Medicine

## 2020-09-05 ENCOUNTER — Emergency Department (HOSPITAL_COMMUNITY)
Admission: EM | Admit: 2020-09-05 | Discharge: 2020-09-05 | Disposition: A | Discharge status: Home / Self Care | Payer: Medicaid Other | Attending: Emergency Medicine | Admitting: Emergency Medicine

## 2020-09-05 DIAGNOSIS — J45909 Unspecified asthma, uncomplicated: Secondary | ICD-10-CM | POA: Diagnosis not present

## 2020-09-05 DIAGNOSIS — R0981 Nasal congestion: Secondary | ICD-10-CM | POA: Diagnosis not present

## 2020-09-05 DIAGNOSIS — H9202 Otalgia, left ear: Secondary | ICD-10-CM | POA: Insufficient documentation

## 2020-09-05 DIAGNOSIS — Z8616 Personal history of COVID-19: Secondary | ICD-10-CM | POA: Insufficient documentation

## 2020-09-05 DIAGNOSIS — J3489 Other specified disorders of nose and nasal sinuses: Secondary | ICD-10-CM | POA: Insufficient documentation

## 2020-09-05 MED ORDER — FLUTICASONE PROPIONATE 50 MCG/ACT NA SUSP: 1.0000 | Freq: Every day | NASAL | 0 refills | Status: DC

## 2020-09-05 MED ORDER — AMOXICILLIN 875 MG PO TABS: 875.0000 mg | ORAL_TABLET | Freq: Two times a day (BID) | ORAL | 0 refills | Status: DC

## 2020-09-05 NOTE — Discharge Instructions (Addendum)
You were seen in the ER today for left ear pain and decreased hearing.  We are sending you home with flonase to use 1 spray per nostril daily to help with congestion/fluid in your ears.   We are also sending you home with amoxicillin, please wait 48 hours before starting to take this as this may improve without antibiotics. If you spike fevers or having worsening pain you can start the antibiotic prior to 48 hours.   We have prescribed you new medication(s) today. Discuss the medications prescribed today with your pharmacist as they can have adverse effects and interactions with your other medicines including over the counter and prescribed medications. Seek medical evaluation if you start to experience new or abnormal symptoms after taking one of these medicines, seek care immediately if you start to experience difficulty breathing, feeling of your throat closing, facial swelling, or rash as these could be indications of a more serious allergic reaction  Take tylenol/motrin per over the counter dosing to help with pain.   Follow up with primary care within 1 week. Return to the ER for new or worsening symptoms including but not limited to new or worsening pain, pain/swelling/redness behind the ear, fever, vomiting, trouble breathing, neck stiffness, or any other concerns.

## 2020-09-05 NOTE — ED Provider Notes (Signed)
[facility]-EMERG[facility] DEPT Provider Note   CSN: [id number] Arrival date & time: 09/05/20  1414     History Chief Complaint  Patient presents with   Otalgia     Brianna Bender is a 20 y.o. female with a hx of sickle cell trait, asthma, and depression who presents to the ED with complaints of left ear pain since last night. Reports pain is constant, no alleviating/aggravating factors, associated sxs include nasal congestion, rhinorrhea, and decreased hearing from the left ear. Denies fever, cough, dyspnea, neck stiffness, or sore throat.   HPI     Past Medical History:  Diagnosis Date   Asthma    Sickle cell trait (HCC) 02/25/2019   QTrimester urine culture - 2nd trimester negative.    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   Suicidal ideation 11/18/2016   Asthma exacerbation 05/06/2013    Past Surgical History:  Procedure Laterality Date   WISDOM TOOTH EXTRACTION       OB History     Gravida  3   Para  1   Term  1   Preterm  0   AB  1   Living  1      SAB  0   IAB  1   Ectopic  0   Multiple  0   Live Births  1           Family History  Problem Relation Age of Onset   Healthy Mother    Healthy Father     Social History   Tobacco Use   Smoking status: Never   Smokeless tobacco: Never  Vaping Use   Vaping Use: Never used  Substance Use Topics   Alcohol use: Not Currently   Drug  use: Not Currently    Types: Marijuana    Home Medications Prior to Admission medications   Medication Sig Start Date End Date Taking? Authorizing Provider  amoxicillin (AMOXIL) 875 MG tablet Take 1 tablet (875 mg total) by mouth 2 (two) times daily. 09/05/20  Yes Tikita Mabee R, PA-C  fluticasone (FLONASE) 50 MCG/ACT nasal spray Place 1 spray into both nostrils daily. 09/05/20  Yes Yailene Badia R, PA-C  acetaminophen (TYLENOL) 325 MG tablet Take 2 tablets (650 mg total) by mouth every 6 (six) hours as needed (for pain scale < 4). 05/12/19   Fair, 05/14/19, MD  albuterol (VENTOLIN HFA) 108 (90 Base) MCG/ACT inhaler Inhale 1-2 puffs into the lungs every 6 (six) hours as needed for wheezing or shortness of breath. 04/17/19   06/17/19, MD  ferrous sulfate 325 (65 FE) MG tablet Take 1 tablet (325 mg total) by mouth every other day. Patient not taking: Reported on 06/06/2019 05/12/19   05/14/19, MD  guaifenesin (ROBITUSSIN) 100 MG/5ML syrup Take 200 mg by mouth 3 (three) times daily as needed for cough.    [provider]    Allergies    Patient has no known allergies.  Review of Systems   Review of Systems  Constitutional:  Negative for chills and fever.  HENT:  Positive for congestion, ear pain, hearing loss and rhinorrhea. Negative for ear discharge, sore throat, trouble swallowing and voice change.   Respiratory:  Negative for cough and shortness of breath.   Cardiovascular:  Negative for chest pain.  Gastrointestinal:  Negative for abdominal pain, diarrhea, nausea and vomiting.  Musculoskeletal:  Negative for neck stiffness.  All other systems reviewed and are negative.  Physical Exam Updated Vital Signs BP 121/90   Pulse 80   Temp 98.5 F (36.9 C) (Oral)   Resp 18   Ht 5\' 2"  (1.575 m)   Wt 85 kg   LMP 10/23/2019   SpO2 98%   Breastfeeding Unknown   BMI 34.27 kg/m   Physical Exam Vitals and nursing note reviewed.  Constitutional:       General: She is not in acute distress.    Appearance: She is well-developed.  HENT:     Head: Normocephalic and atraumatic.     Right Ear: Ear canal normal. Tympanic membrane is not perforated, erythematous, retracted or bulging.     Left Ear: Ear canal normal. Tympanic membrane is erythematous. Tympanic membrane is not perforated or retracted.     Ears:     Comments: No canal edema or drainage. Fullness present to bilateral TMs.  Mild bulging of the left TM. No mastoid erythema/swelling/tenderness.     Nose: Congestion present.     Right Sinus: No maxillary sinus tenderness or frontal sinus tenderness.     Left Sinus: No maxillary sinus tenderness or frontal sinus tenderness.     Mouth/Throat:     Pharynx: Uvula midline. No oropharyngeal exudate or posterior oropharyngeal erythema.     Comments: Posterior oropharynx is symmetric appearing. Patient tolerating own secretions without difficulty. No trismus. No drooling. No hot potato voice. No swelling beneath the tongue, submandibular compartment is soft.  Eyes:     General:        Right eye: No discharge.        Left eye: No discharge.     Conjunctiva/sclera: Conjunctivae normal.     Pupils: Pupils are equal, round, and reactive to light.  Cardiovascular:     Rate and Rhythm: Normal rate and regular rhythm.     Heart sounds: No murmur heard. Pulmonary:     Effort: Pulmonary effort is normal. No respiratory distress.     Breath sounds: Normal breath sounds. No wheezing, rhonchi or rales.  Abdominal:     General: There is no distension.     Palpations: Abdomen is soft.     Tenderness: There is no abdominal tenderness.  Musculoskeletal:     Cervical back: Normal range of motion and neck supple. No edema or rigidity.  Lymphadenopathy:     Cervical: No cervical adenopathy.  Skin:    General: Skin is warm and dry.     Findings: No rash.  Neurological:     Mental Status: She is alert.  Psychiatric:        Behavior: Behavior normal.     ED Results / Procedures / Treatments   Labs (all labs ordered are listed, but only abnormal results are displayed) Labs Reviewed - No data to display  EKG None  Radiology No results found.  Procedures Procedures   Medications Ordered in ED Medications - No data to display  ED Course  I have reviewed the triage vital signs and the nursing notes.  Pertinent labs & imaging results that were available during my care of the patient were reviewed by me and considered in my medical decision making (see chart for details).    MDM Rules/Calculators/A&P                           Patient presents to the ED with complaints of left ear pain since last night. Nontoxic, vitals WNL.   Additional history obtained:  Additional history obtained from chart review & nursing note review.   ED Course:  On exam patient has nasal congestion with fullness to the bilateral TMs, left TM is erythematous with mild bulging.  There is no TM perforation.  Exam is not consistent with acute otitis externa, or mastoiditis.  There is no meningismus.  Patient's hearing is grossly intact.  Possible developing acute otitis media of the left ear.  I instructed patient to use Flonase and also provided a prescription for amoxicillin to take should symptoms not improve within 48 hours. Discussed watch and wait method with the patient. I discussed  treatment plan, need for follow-up, and return precautions with the patient. Provided opportunity for questions, patient confirmed understanding and is in agreement with plan.   Portions of this note were generated with Scientist, clinical (histocompatibility and immunogenetics). Dictation errors may occur despite best attempts at proofreading.  Final Clinical Impression(s) / ED Diagnoses Final diagnoses:  Left ear pain    Rx / DC Orders ED Discharge Orders          Ordered    fluticasone (FLONASE) 50 MCG/ACT nasal spray  Daily        09/05/20 1453    amoxicillin (AMOXIL) 875 MG tablet  2 times  daily        09/05/20 [address] [place], PA-C 09/05/20 1513    Ernie Avena, MD 09/05/20 1622

## 2020-09-05 NOTE — ED Triage Notes (Signed)
Complains that she cannot hear out of her L ear and ear pain since last night, also complains of cough. Denies fever or sore throat.

## 2021-01-20 DIAGNOSIS — Z3045 Encounter for surveillance of transdermal patch hormonal contraceptive device: Secondary | ICD-10-CM | POA: Diagnosis not present

## 2021-04-13 ENCOUNTER — Encounter (HOSPITAL_COMMUNITY): Payer: Self-pay

## 2021-04-13 ENCOUNTER — Emergency Department (HOSPITAL_COMMUNITY)
Admission: EM | Admit: 2021-04-13 | Discharge: 2021-04-13 | Disposition: A | Discharge status: Home / Self Care | Payer: Medicaid Other | Attending: Emergency Medicine | Admitting: Emergency Medicine

## 2021-04-13 ENCOUNTER — Other Ambulatory Visit: Payer: Self-pay

## 2021-04-13 DIAGNOSIS — B9689 Other specified bacterial agents as the cause of diseases classified elsewhere: Secondary | ICD-10-CM | POA: Insufficient documentation

## 2021-04-13 DIAGNOSIS — N9489 Other specified conditions associated with female genital organs and menstrual cycle: Secondary | ICD-10-CM | POA: Insufficient documentation

## 2021-04-13 DIAGNOSIS — N939 Abnormal uterine and vaginal bleeding, unspecified: Secondary | ICD-10-CM | POA: Diagnosis not present

## 2021-04-13 DIAGNOSIS — N760 Acute vaginitis: Secondary | ICD-10-CM | POA: Diagnosis not present

## 2021-04-13 LAB — CBC WITH DIFFERENTIAL/PLATELET
Abs Immature Granulocytes: 0.03 10*3/uL (ref 0.00–0.07)
Basophils Absolute: 0 10*3/uL (ref 0.0–0.1)
Basophils Relative: 0 %
Eosinophils Absolute: 0.3 10*3/uL (ref 0.0–0.5)
Eosinophils Relative: 3 %
HCT: 34.1 % — ABNORMAL LOW (ref 36.0–46.0)
Hemoglobin: 11.3 g/dL — ABNORMAL LOW (ref 12.0–15.0)
Immature Granulocytes: 0 %
Lymphocytes Relative: 29 %
Lymphs Abs: 2.8 10*3/uL (ref 0.7–4.0)
MCH: 26.5 pg (ref 26.0–34.0)
MCHC: 33.1 g/dL (ref 30.0–36.0)
MCV: 80 fL (ref 80.0–100.0)
Monocytes Absolute: 0.5 10*3/uL (ref 0.1–1.0)
Monocytes Relative: 6 %
Neutro Abs: 5.9 10*3/uL (ref 1.7–7.7)
Neutrophils Relative %: 62 %
Platelets: 421 10*3/uL — ABNORMAL HIGH (ref 150–400)
RBC: 4.26 MIL/uL (ref 3.87–5.11)
RDW: 14 % (ref 11.5–15.5)
WBC: 9.6 10*3/uL (ref 4.0–10.5)
nRBC: 0 % (ref 0.0–0.2)

## 2021-04-13 LAB — URINALYSIS, ROUTINE W REFLEX MICROSCOPIC
Bilirubin Urine: NEGATIVE
Glucose, UA: NEGATIVE mg/dL
Ketones, ur: NEGATIVE mg/dL
Nitrite: NEGATIVE
Protein, ur: NEGATIVE mg/dL
Specific Gravity, Urine: 1.01 (ref 1.005–1.030)
WBC, UA: >50 WBC/hpf — ABNORMAL HIGH (ref 0–5)
pH: 5 (ref 5.0–8.0)

## 2021-04-13 LAB — COMPREHENSIVE METABOLIC PANEL
ALT: 12 U/L (ref 0–44)
AST: 13 U/L — ABNORMAL LOW (ref 15–41)
Albumin: 3.3 g/dL — ABNORMAL LOW (ref 3.5–5.0)
Alkaline Phosphatase: 49 U/L (ref 38–126)
Anion gap: 6 (ref 5–15)
BUN: 10 mg/dL (ref 6–20)
CO2: 25 mmol/L (ref 22–32)
Calcium: 8.7 mg/dL — ABNORMAL LOW (ref 8.9–10.3)
Chloride: 106 mmol/L (ref 98–111)
Creatinine, Ser: 0.81 mg/dL (ref 0.44–1.00)
GFR, Estimated: >60 mL/min (ref >60)
Glucose, Bld: 98 mg/dL (ref 70–99)
Potassium: 4.5 mmol/L (ref 3.5–5.1)
Sodium: 137 mmol/L (ref 135–145)
Total Bilirubin: 0.1 mg/dL — ABNORMAL LOW (ref 0.3–1.2)
Total Protein: 7.1 g/dL (ref 6.5–8.1)

## 2021-04-13 LAB — WET PREP, GENITAL
Sperm: NONE SEEN
Trich, Wet Prep: NONE SEEN
WBC, Wet Prep HPF POC: <10 (ref <10)
Yeast Wet Prep HPF POC: NONE SEEN

## 2021-04-13 LAB — I-STAT BETA HCG BLOOD, ED (MC, WL, AP ONLY): I-stat hCG, quantitative: <5 m[IU]/mL (ref <5)

## 2021-04-13 MED ORDER — METRONIDAZOLE 500 MG PO TABS: 500.0000 mg | ORAL_TABLET | Freq: Two times a day (BID) | ORAL | 0 refills | Status: DC

## 2021-04-13 NOTE — Discharge Instructions (Addendum)
As we discussed today we recommend that you follow-up with OB/GYN. Please call them to set up an appointment.  Please tell them that you were a emergency room visit follow-up.  If you develop fevers, bad abdominal pains or cramping, you have a significant increase in your bleeding or any other new or concerning symptoms please seek additional medical care and evaluation.  You may have diarrhea from the antibiotics.  It is very important that you continue to take the antibiotics even if you get diarrhea unless a medical professional tells you that you may stop taking them.  If you stop too early the bacteria you are being treated for will become stronger and you may need different, more powerful antibiotics that have more side effects and worsening diarrhea.  Please stay well hydrated and consider probiotics as they may decrease the severity of your diarrhea.  Please be aware that if you take any hormonal contraception (birth control pills, nexplanon, the ring, patch etc) that your birth control will not work while you are taking antibiotics and you need to use back up protection as directed on the birth control medication information insert.   Today your diagnosed with bacterial vaginosis and received a prescription for metronidazole also known as Flagyl. It is very important that you do not consume any alcohol while taking this medication as it will cause you to become violently ill.

## 2021-04-13 NOTE — ED Triage Notes (Signed)
Pt states that she has been having heavy vaginal bleeding every other week for the past month and a half. Pt reports being on the birth control patch but has never had these issues before.

## 2021-04-13 NOTE — ED Provider Triage Note (Signed)
Emergency Medicine Provider Triage Evaluation Note  Brianna Bender , a 21 y.o. female  was evaluated in triage.  Pt complains of vaginal bleeding.  She reports that she has been taking birth control.  She is sexually active.  She she is on the patch for a year for birth control.  She states that she had had three episodes of week long bleeding.  She isn't supposed to have a period for another two weeks until she takes off the patches.  No blood thinning medications.  No fevers.   She reports that when she wakes up she has a headache and standing for long periods of time makes her feel like she is going to faint.   When she is not bleeding she is not having any abnormal discharge.   Physical Exam  BP 135/88 (BP Location: Left Arm)    Pulse (!) 58    Temp 97.8 F (36.6 C) (Oral)    Resp 18    Ht 5\' 3"  (1.6 m)    Wt 80.7 kg    SpO2 100%    BMI 31.53 kg/m  Gen:   Awake, no distress  Resp:  Normal effort  MSK:   Moves extremities without difficulty  Other:  Normal speech.   Medical Decision Making  Medically screening exam initiated at 7:06 PM.  Appropriate orders placed.  Brianna Bender was informed that the remainder of the evaluation will be completed by another provider, this initial triage assessment does not replace that evaluation, and the importance of remaining in the ED until their evaluation is complete.  Note: Portions of this report may have been transcribed using voice recognition software. Every effort was made to ensure accuracy; however, inadvertent computerized transcription errors may be present    Caryl Bis, PA-C 04/13/21 1912

## 2021-04-13 NOTE — ED Provider Notes (Signed)
[facility]-EMERGENCY DEPT Provider Note   CSN: [id number] Arrival date & time: 04/13/21  1851     History  Chief Complaint  Patient presents with   Vaginal Bleeding    Brianna Bender is a 21 y.o. female who presents today for evaluation of vaginal bleeding. She states that over the past month she has had about 3 episodes of weeklong bleeding.  She states that this is different from usual menstrual cycle and that she is not having.  She states that she uses a patch for birth control.  She states she is not due to have a cycle for another 2 weeks.  She states she is sexually active, denies any concern for bleeding.  She has never had anything like this before.  He complains any however is not currently having any pain.  Not take any blood thinning medications.  In between week of bleeding she is not having any abnormal discharge.  She states that she is changing her pad only once a day.  HPI     Home Medications Prior to Admission medications   Medication Sig Start Date End Date Taking? Authorizing Provider  metroNIDAZOLE (FLAGYL) 500 MG tablet Take 1 tablet (500 mg total) by mouth 2 (two) times daily. 04/13/21  Yes Cristina Gong, PA-C  acetaminophen (TYLENOL) 325 MG tablet Take 2 tablets (650 mg total) by mouth every 6 (six) hours as needed (for pain scale < 4). 05/12/19   Fair, Hoyle Sauer, MD  albuterol (VENTOLIN HFA) 108 (90 Base) MCG/ACT inhaler Inhale 1-2 puffs into the lungs every 6 (six) hours as needed for wheezing or shortness of breath. 04/17/19   [place] Bing, MD  amoxicillin (AMOXIL) 875 MG tablet Take 1 tablet (875 mg total) by mouth 2 (two) times daily. 09/05/20   Petrucelli, Samantha R, PA-C  ferrous sulfate 325 (65 FE) MG tablet Take 1 tablet (325 mg total) by mouth every other day. Patient not taking: Reported on 06/06/2019 05/12/19   Joselyn Arrow, MD  fluticasone [facility]) 50 MCG/ACT nasal spray Place 1 spray into both nostrils daily. 09/05/20    Petrucelli, Samantha R, PA-C  guaifenesin (ROBITUSSIN) 100 MG/5ML syrup Take 200 mg by mouth 3 (three) times daily as needed for cough.    [provider]      Allergies    Patient has no known allergies.    Review of Systems   Review of Systems  Physical Exam Updated Vital Signs BP 121/69 (BP Location: Right Arm)    Pulse 81    Temp 97.8 F (36.6 C) (Oral)    Resp 16    Ht 5\' 3"  (1.6 m)    Wt 80.7 kg    SpO2 99%    BMI 31.53 kg/m  Physical Exam Vitals and nursing note reviewed. Exam conducted with a chaperone present (Patient's primary RN).  Constitutional:      General: She is not in acute distress.    Appearance: She is not ill-appearing or diaphoretic.  HENT:     Head: Normocephalic and atraumatic.  Eyes:     General: No scleral icterus.       Right eye: No discharge.        Left eye: No discharge.     Conjunctiva/sclera: Conjunctivae normal.  Cardiovascular:     Rate and Rhythm: Normal rate and regular rhythm.  Pulmonary:     Effort: Pulmonary effort is normal. No respiratory distress.     Breath sounds: No stridor.  Abdominal:     General: There is no distension.  Genitourinary:    Comments: Normal external female genitalia.  There is scant blood in the vaginal canal with a small amount coming from the cervix.  There is moderate amount of discharge in the vaginal canal no cervical motion tenderness.  No adnexal tenderness or fullness. Musculoskeletal:        General: No deformity.     Cervical back: Normal range of motion and neck supple.     Comments: No obvious acute injury  Skin:    General: Skin is warm and dry.  Neurological:     Mental Status: She is alert.     Motor: No abnormal muscle tone.     Comments: Awake and alert, answers all questions appropriately.  Speech is not slurred.  Psychiatric:        Mood and Affect: Mood normal.        Behavior: Behavior normal.    ED Results / Procedures / Treatments   Labs (all labs ordered are listed,  but only abnormal results are displayed) Labs Reviewed  WET PREP, GENITAL - Abnormal; Notable for the following components:      Result Value   Clue Cells Wet Prep HPF POC PRESENT (*)    All other components within normal limits  CBC WITH DIFFERENTIAL/PLATELET - Abnormal; Notable for the following components:   Hemoglobin 11.3 (*)    HCT 34.1 (*)    Platelets 421 (*)    All other components within normal limits  COMPREHENSIVE METABOLIC PANEL - Abnormal; Notable for the following components:   Calcium 8.7 (*)    Albumin 3.3 (*)    AST 13 (*)    Total Bilirubin 0.1 (*)    All other components within normal limits  URINALYSIS, ROUTINE W REFLEX MICROSCOPIC - Abnormal; Notable for the following components:   APPearance CLOUDY (*)    Hgb urine dipstick LARGE (*)    Leukocytes,Ua LARGE (*)    WBC, UA >50 (*)    Bacteria, UA RARE (*)    All other components within normal limits  I-STAT BETA HCG BLOOD, ED ([facility], [facility], [facility] ONLY)  GC/CHLAMYDIA PROBE AMP ([place]) NOT AT [facility]    EKG None  Radiology No results found.  Procedures Procedures    Medications Ordered in ED Medications - No data to display  ED Course/ Medical Decision Making/ A&P Clinical Course as of 04/13/21 2240  Tue Apr 13, 2021  2018 Clue Cells Wet Prep HPF POC(!): PRESENT Consistent with BV as anticipated based on pelvic exam. [EH]  2019 Squamous Epithelial / LPF: 11-20 UA has 11-20 squamous epithelial cells with over 50 white cells and 21-50 red cells.  There is rare bacteria, nitrite negative without protein and is not convincingly infected. [EH]  2019 Orthostatic vitals do not show significant orthostatic changes. [EH]    Clinical Course User Index [EH] Cristina Gong, PA-C                           Medical Decision Making Patient is a 21 year old woman who presents today for evaluation of abnormal vaginal bleeding.  She is not having pelvic pain or cramping.  Here her pregnancy test is negative.   Her hemoglobin is slightly low however is consistent with her baseline.  CMP without acute significant abnormalities.  UA is cloudy and does have over 50 white, however she is not having dysuria frequency or  urgency and I suspect this is contamination. GC testing is sent. Wet prep shows clue cells consistent with BV which is consistent with her clinical exam. She had minimal blood in the vaginal canal.  At this point I feel she is safe for outpatient OB/GYN follow-up.   As she is stable and I anticipate appropriate and timely follow up we discussed role of emergent ultrasound which she declined for now.  Will treat BV with Flagyl.  She is advised not to drink alcohol while taking this medication. She was offered empiric GC treatment however she declined this wishing instead to await her results.  Doubt PID.    Amount and/or Complexity of Data Reviewed Labs: ordered. Decision-making details documented in ED Course.  Risk Prescription drug management.   Return precautions were discussed with patient who states their understanding.  At the time of discharge patient denied any unaddressed complaints or concerns.  Patient is agreeable for discharge home.  Note: Portions of this report may have been transcribed using voice recognition software. Every effort was made to ensure accuracy; however, inadvertent computerized transcription errors may be present          Final Clinical Impression(s) / ED Diagnoses Final diagnoses:  BV (bacterial vaginosis)  Abnormal uterine bleeding (AUB)    Rx / DC Orders ED Discharge Orders          Ordered    metroNIDAZOLE (FLAGYL) 500 MG tablet  2 times daily        04/13/21 2121              Norman Clay 04/13/21 2244    Ernie Avena, MD 04/13/21 [phone]

## 2021-04-14 ENCOUNTER — Telehealth: Payer: Self-pay

## 2021-04-14 LAB — GC/CHLAMYDIA PROBE AMP (~~LOC~~) NOT AT ARMC
Chlamydia: NEGATIVE
Comment: NEGATIVE
Comment: NORMAL
Neisseria Gonorrhea: NEGATIVE

## 2021-04-14 NOTE — Telephone Encounter (Signed)
Transition Care Management Unsuccessful Follow-up Telephone Call ? ?Date of discharge and from where:  04/13/2021 from [place] ? ?Attempts:  1st Attempt ? ?Reason for unsuccessful TCM follow-up call:  Left voice message ? ? ? ?

## 2021-04-15 NOTE — Telephone Encounter (Signed)
Transition Care Management Unsuccessful Follow-up Telephone Call ? ?Date of discharge and from where:  04/13/2021 from [place] ? ?Attempts:  2nd Attempt ? ?Reason for unsuccessful TCM follow-up call:  Left voice message ? ? ? ?

## 2021-04-16 NOTE — Telephone Encounter (Signed)
Transition Care Management Follow-up Telephone Call ?Date of discharge and from where: 04/13/2021 from [facility]  ?How have you been since you were released from the hospital? Patient stated that she is feeling better and was able to start the Metronidazole that was rx'ed from the ED.  ?Any questions or concerns? No ? ?Items Reviewed: ?Did the pt receive and understand the discharge instructions provided? Yes  ?Medications obtained and verified? Yes  ?Other? No  ?Any new allergies since your discharge? No  ?Dietary orders reviewed? No ?Do you have support at home? Yes  ? ?Functional Questionnaire: (I = Independent and D = Dependent) ?ADLs: I ? ?Bathing/Dressing- I ? ?Meal Prep- I ? ?Eating- I ? ?Maintaining continence- I ? ?Transferring/Ambulation- I ? ?Managing Meds- I ? ? ?Follow up appointments reviewed: ? ?PCP Hospital f/u appt confirmed? No   ?Specialist Hospital f/u appt confirmed? No  Patient is calling OBGYN for an appt.  ?Are transportation arrangements needed? No  ?If their condition worsens, is the pt aware to call PCP or go to the Emergency Dept.? Yes ?Was the patient provided with contact information for the PCP's office or ED? Yes ?Was to pt encouraged to call back with questions or concerns? Yes ? ?

## 2021-06-24 DIAGNOSIS — Z113 Encounter for screening for infections with a predominantly sexual mode of transmission: Secondary | ICD-10-CM | POA: Diagnosis not present

## 2021-07-08 ENCOUNTER — Ambulatory Visit (INDEPENDENT_AMBULATORY_CARE_PROVIDER_SITE_OTHER): Payer: Medicaid Other

## 2021-07-08 ENCOUNTER — Ambulatory Visit (HOSPITAL_COMMUNITY): Payer: Medicaid Other

## 2021-07-08 ENCOUNTER — Encounter (HOSPITAL_COMMUNITY): Payer: Self-pay | Admitting: *Deleted

## 2021-07-08 ENCOUNTER — Other Ambulatory Visit: Payer: Self-pay

## 2021-07-08 ENCOUNTER — Ambulatory Visit (HOSPITAL_COMMUNITY)
Admission: EM | Admit: 2021-07-08 | Discharge: 2021-07-08 | Disposition: A | Discharge status: Home / Self Care | Payer: Medicaid Other | Attending: Emergency Medicine | Admitting: Emergency Medicine

## 2021-07-08 DIAGNOSIS — M25511 Pain in right shoulder: Secondary | ICD-10-CM

## 2021-07-08 MED ORDER — IBUPROFEN 100 MG/5ML PO SUSP
ORAL | Status: AC
  Filled 2021-07-08: qty 20

## 2021-07-08 MED ORDER — METHOCARBAMOL 500 MG PO TABS: 500.0000 mg | ORAL_TABLET | Freq: Two times a day (BID) | ORAL | 0 refills | Status: AC

## 2021-07-08 MED ORDER — IBUPROFEN 800 MG PO TABS
400.0000 mg | ORAL_TABLET | Freq: Once | ORAL | Status: AC
  Administered 2021-07-08: 400 mg via ORAL

## 2021-07-08 NOTE — Discharge Instructions (Addendum)
Please take muscle relaxer as prescribed. This medicine might make you drowsy. If this occurs, take only at night. Use in combination with ibuprofen every 4-6 hours. You might feel sore for a few days. You can try hot pad or ice to the shoulder.  If symptoms worsen or do not improve, please go to the emergency department.
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
## 2021-10-04 IMAGING — DX DG CHEST 2V
1 series · 1 of 1 positions shown · non-contrast
Comparison: PA and lateral chest 11/08/2015.

CLINICAL DATA: Shortness of breath for 1 week. History of sickle
cell disease.

EXAM:
CHEST - 2 VIEW

[chest ap]
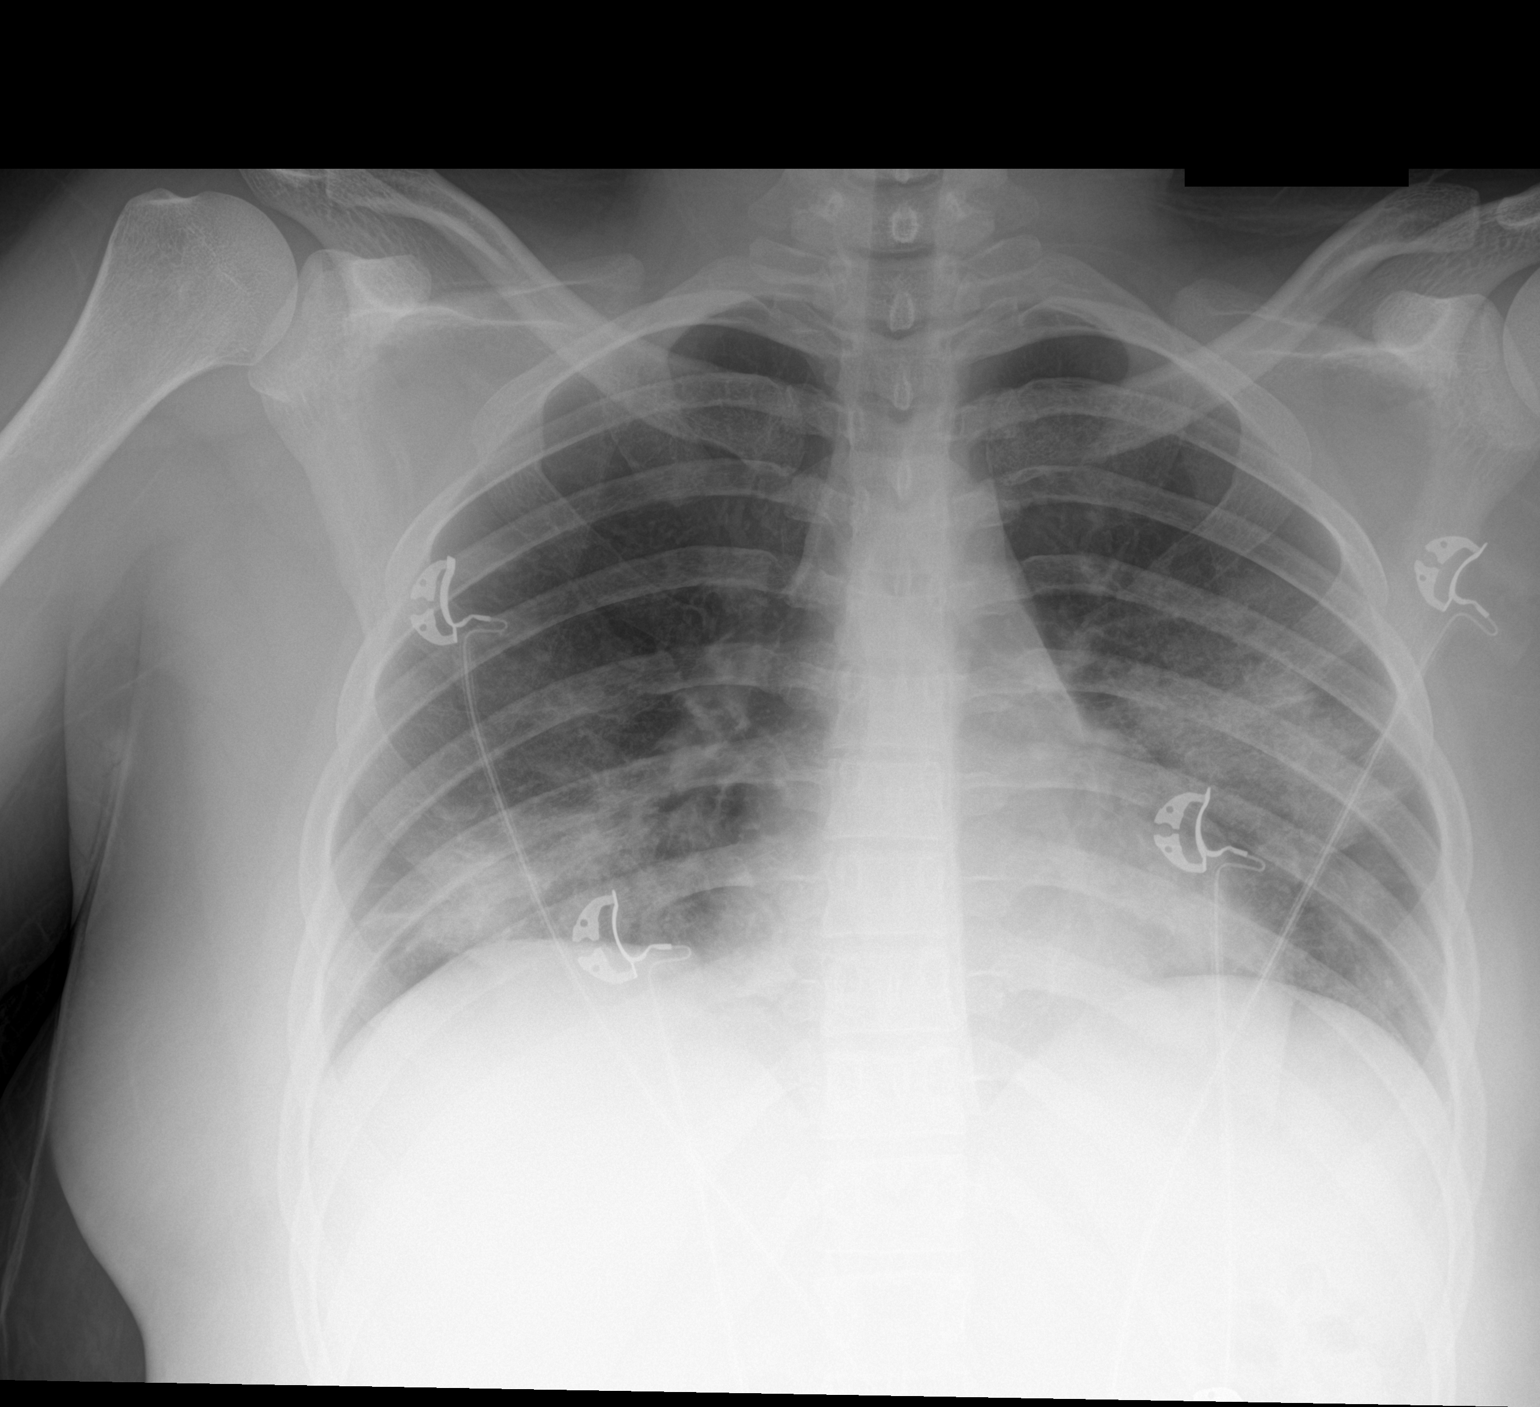

[1 of 1 positions shown; findings below may reference images not displayed]

FINDINGS: The patient has right basilar and left mid lung airspace disease
consistent with pneumonia. No pneumothorax or pleural effusion.
Heart size is normal. No acute or focal bony abnormality.
IMPRESSION: Right basilar and left midlung airspace disease consistent with
pneumonia. Atypical/viral infection is possible.

## 2021-10-08 IMAGING — CT CT ANGIO CHEST
2 of 6 series · 18 of 36 positions shown · IV contrast (omnipaque)
Comparison: Radiograph 09/30/2019

CLINICAL DATA: Shortness of breath, I3FJ1-U7 positive

EXAM:
CT ANGIOGRAPHY CHEST WITH CONTRAST
TECHNIQUE: Multidetector CT imaging of the chest was performed using the
standard protocol during bolus administration of intravenous
contrast. Multiplanar CT image reconstructions and MIPs were
obtained to evaluate the vascular anatomy.
CONTRAST:  100mL OMNIPAQUE IOHEXOL 350 MG/ML SOLN

[Series 5: thins · axial · 0.72mm/px · z∈[-250,-26]mm · 17 of 253 slices shown]
[im 15/253  lung]
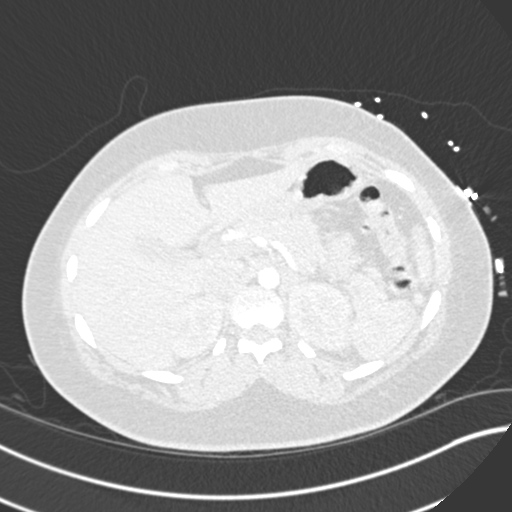
[im 29/253  mediastinal]
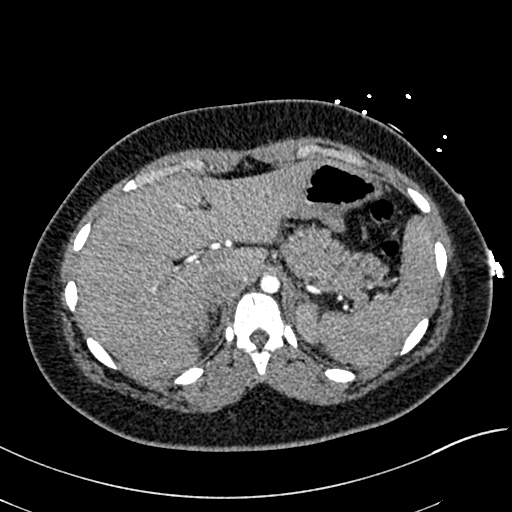
[im 43/253  lung]
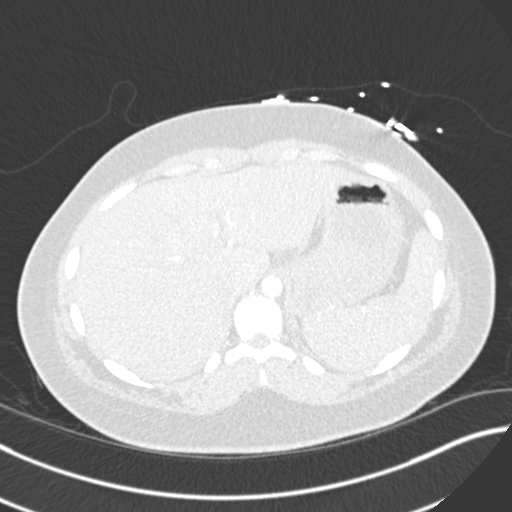
[im 57/253  mediastinal]
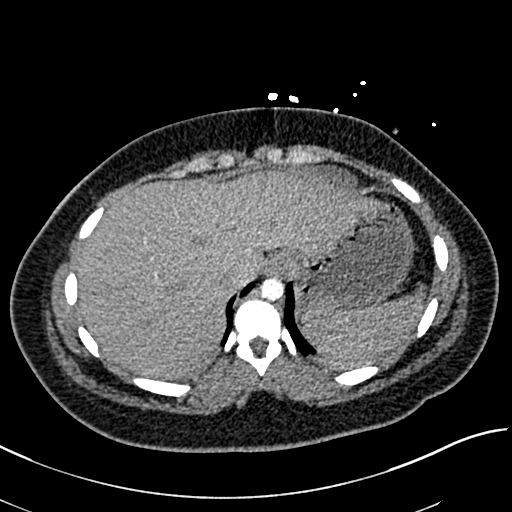
[im 71/253  lung]
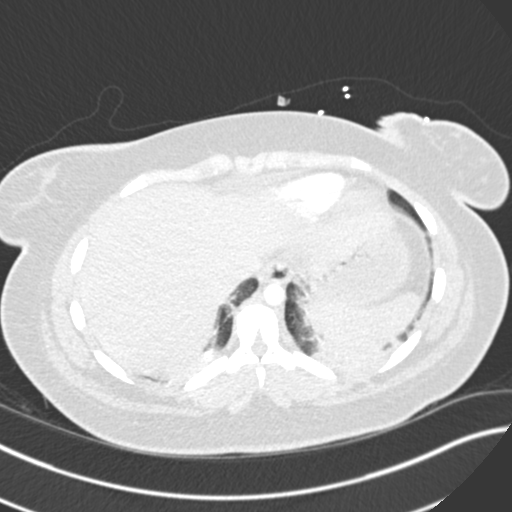
[im 85/253  mediastinal]
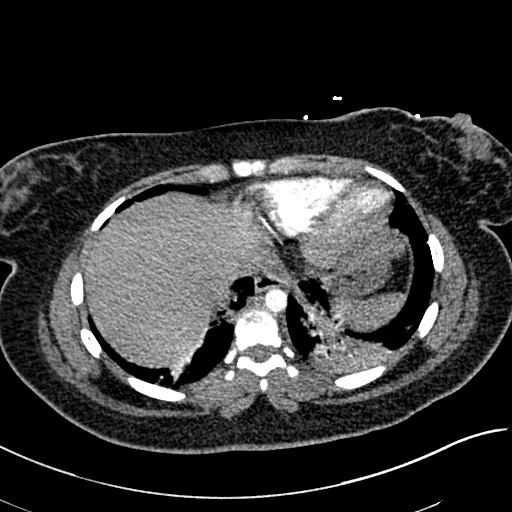
[im 99/253  lung]
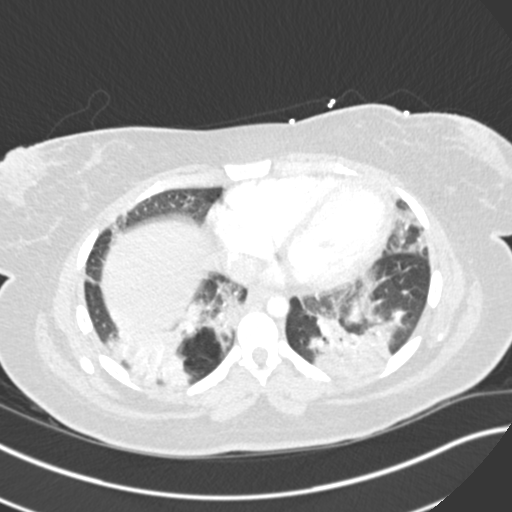
[im 113/253  mediastinal]
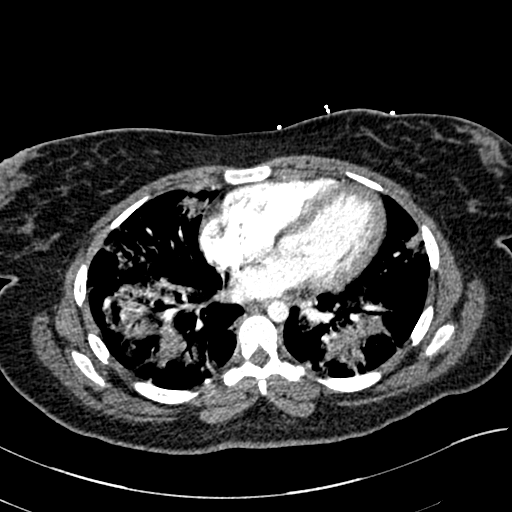
[im 127/253  lung]
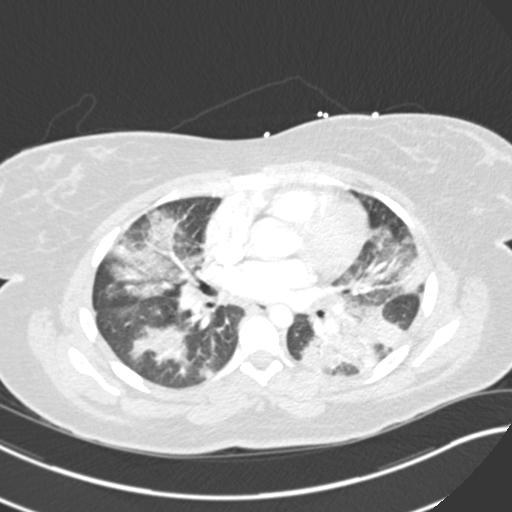
[im 141/253  mediastinal]
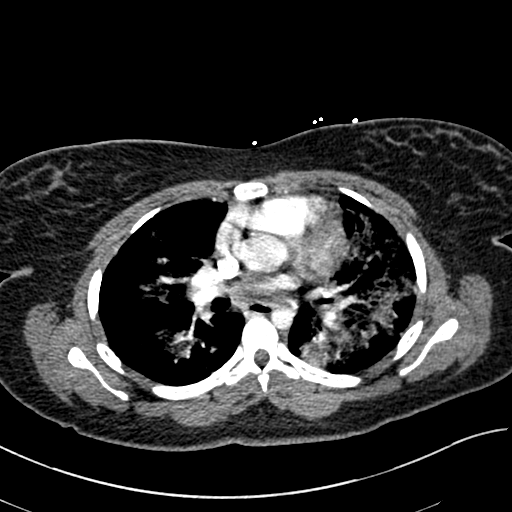
[im 155/253  lung]
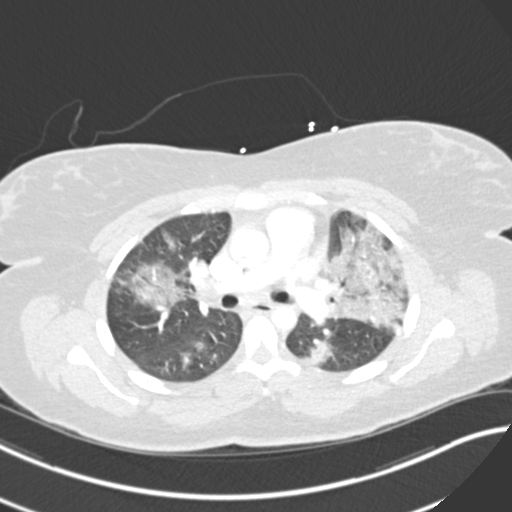
[im 169/253  mediastinal]
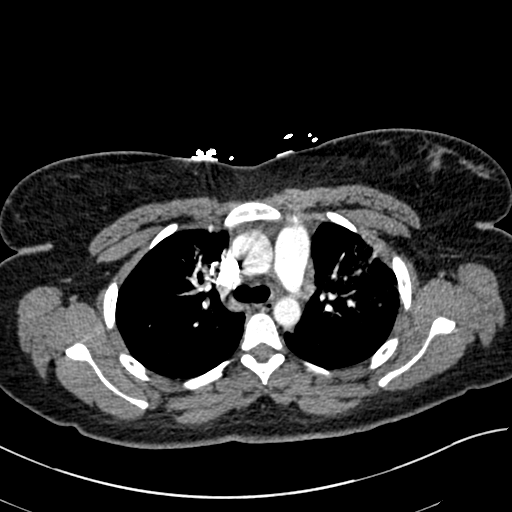
[im 183/253  lung]
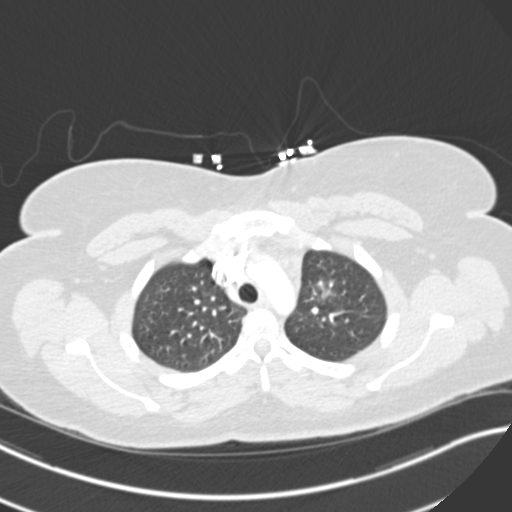
[im 197/253  mediastinal]
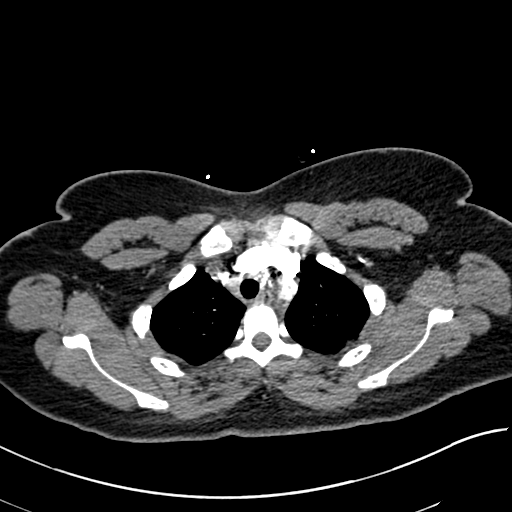
[im 211/253  lung]
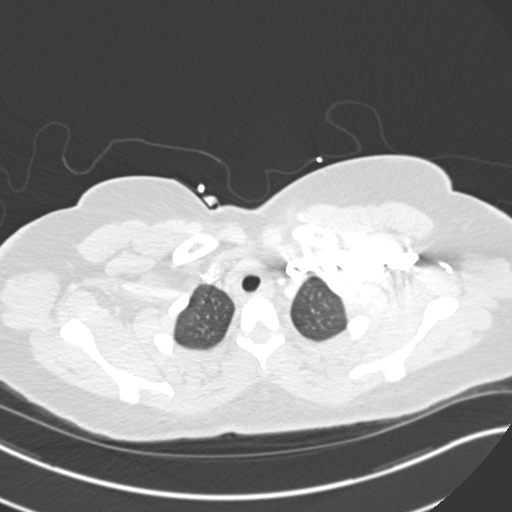
[im 225/253  mediastinal]
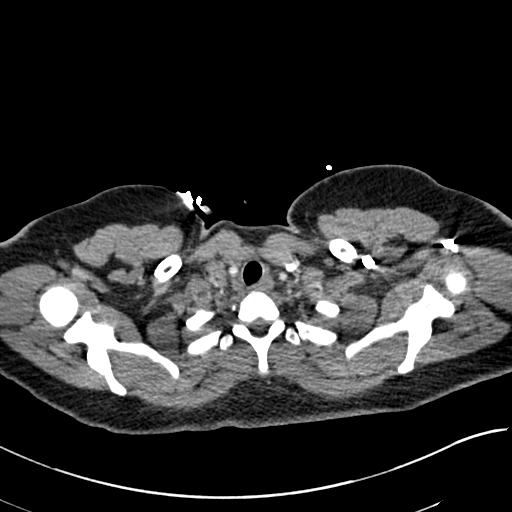
[im 239/253  lung]
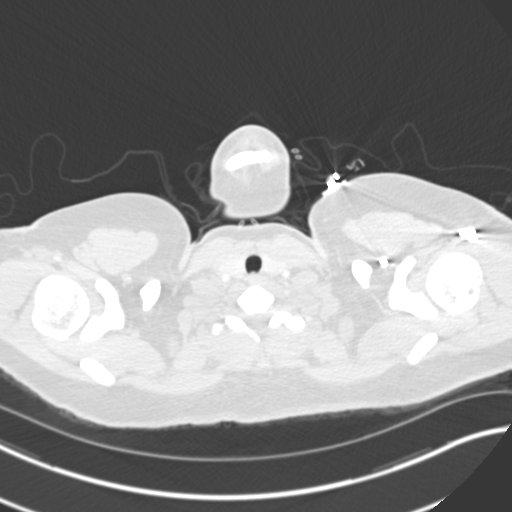

[Series 6: coronal mpr · coronal · 0.54mm/px · 1 of 126 slices shown]
[im 63/126  mediastinal]
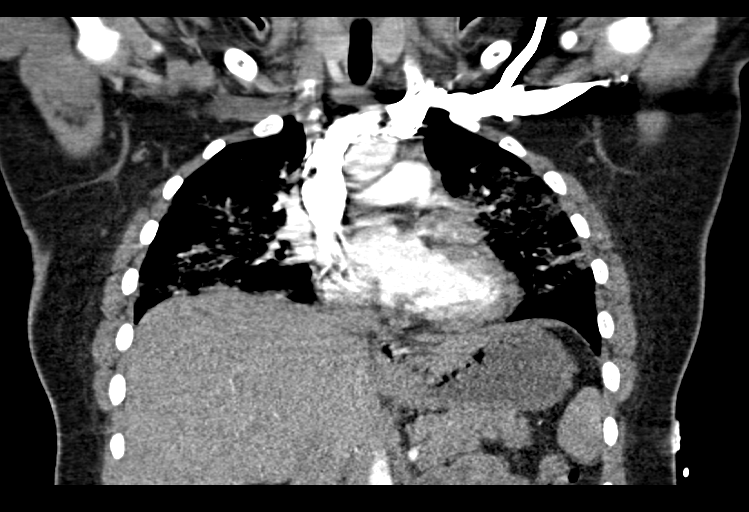

[18 of 36 positions shown; findings below may reference images not displayed]

FINDINGS: Cardiovascular: Satisfactory opacification of the pulmonary
arteries. No large central or lobar pulmonary artery emboli.
Respiratory motion significantly limits detection of smaller,
subsegmental pulmonary emboli. Central pulmonary arteries are normal
caliber. Normal heart size. No pericardial effusion. The aortic root
is suboptimally assessed given cardiac pulsation artifact. No gross
aortic abnormality. Proximal great vessels are unremarkable.

Mediastinum/Nodes: Low-attenuation subcentimeter mediastinal and
hilar lymph nodes are present, favored to be reactive. No worrisome
enlarged nodes within the included lymphatic chains. No acute
abnormality of the trachea or esophagus. Thyroid gland and thoracic
inlet are unremarkable.

Lungs/Pleura: Multifocal areas of mixed ground-glass and
consolidation throughout both lungs with additional more bandlike
areas of opacity likely reflecting subsegmental atelectasis and/or
developing scarring. No pneumothorax or effusion.

Upper Abdomen: No acute abnormalities present in the visualized
portions of the upper abdomen.

Musculoskeletal: No chest wall mass or suspicious bone lesions
identified.

Review of the MIP images confirms the above findings.
IMPRESSION: 1. Satisfactory opacification of the pulmonary arteries. No large
central or lobar pulmonary artery emboli. Respiratory motion
significantly limits detection of smaller, subsegmental pulmonary
emboli.
2. Multifocal areas of mixed ground-glass and consolidation
throughout both lungs compatible with multifocal infection in the
setting of I3FJ1-U7. Additional bandlike opacities may reflect
subsegmental atelectatic change and/or scarring.

## 2021-11-18 ENCOUNTER — Ambulatory Visit: Payer: Self-pay | Admitting: Nurse Practitioner

## 2021-12-08 ENCOUNTER — Ambulatory Visit: Payer: Self-pay | Admitting: Nurse Practitioner

## 2021-12-27 ENCOUNTER — Ambulatory Visit: Payer: Medicaid Other | Admitting: Nurse Practitioner

## 2021-12-29 ENCOUNTER — Other Ambulatory Visit: Payer: Self-pay

## 2021-12-29 ENCOUNTER — Emergency Department (HOSPITAL_COMMUNITY)
Admission: EM | Admit: 2021-12-29 | Discharge: 2021-12-29 | Disposition: A | Discharge status: Home / Self Care | Payer: Medicaid Other | Attending: Emergency Medicine | Admitting: Emergency Medicine

## 2021-12-29 DIAGNOSIS — J029 Acute pharyngitis, unspecified: Secondary | ICD-10-CM | POA: Insufficient documentation

## 2021-12-29 DIAGNOSIS — Z20822 Contact with and (suspected) exposure to covid-19: Secondary | ICD-10-CM | POA: Insufficient documentation

## 2021-12-29 LAB — SARS CORONAVIRUS 2 BY RT PCR: SARS Coronavirus 2 by RT PCR: NEGATIVE

## 2021-12-29 LAB — MONONUCLEOSIS SCREEN: Mono Screen: NEGATIVE

## 2021-12-29 LAB — GROUP A STREP BY PCR: Group A Strep by PCR: NOT DETECTED

## 2021-12-29 MED ORDER — PENICILLIN G BENZATHINE 1200000 UNIT/2ML IM SUSY
1.2000 10*6.[IU] | PREFILLED_SYRINGE | Freq: Once | INTRAMUSCULAR | Status: AC
  Administered 2021-12-29: 1.2 10*6.[IU] via INTRAMUSCULAR
  Filled 2021-12-29: qty 2

## 2021-12-29 MED ORDER — ACETAMINOPHEN 500 MG PO TABS
1000.0000 mg | ORAL_TABLET | Freq: Once | ORAL | Status: AC
  Administered 2021-12-29: 1000 mg via ORAL
  Filled 2021-12-29: qty 2

## 2021-12-29 NOTE — ED Triage Notes (Signed)
Patient complains of sore throat x 3 days with fever, congestion and cold symptoms x 2 weeks. Alert and oriented, NAD

## 2021-12-29 NOTE — Discharge Instructions (Signed)
You were treated for strep pharyngitis on today's visit, you should begin to feel better in the next 48 hours.  If you experience any difficulty swallowing, worsening symptoms, shortness of breath you will need to return to the emergency department.

## 2021-12-29 NOTE — ED Provider Notes (Signed)
[facility] EMERGENCY DEPARTMENT Provider Note   CSN: [id number] Arrival date & time: 12/29/21  0757     History  No chief complaint on file.   Brianna Bender is a 21 y.o. female.  21 year old female with no past medical history presents to the ED with a chief complaint of congestion, generalized body aches, fatigue and sore throat which began approximately 3 days ago.  She begins reporting her symptoms for started around 3 weeks ago, they somewhat resolved however now has had worsening sore throat, dysphagia, has tried taking over-the-counter NyQuil, DayQuil, Robitussin, Mucinex, TheraFlu without any improvement in symptoms.  Just exacerbated with eating and drinking, alleviated with nothing.  Endorses a fever with a Tmax of 102 at home, has been taking Tylenol for this.  Arrived to the ED febrile with a temperature of 100.5. She denies any shortness of breath, chest pain or other complaints.   The history is provided by the patient and medical records.       Home Medications Prior to Admission medications   Medication Sig Start Date End Date Taking? Authorizing Provider  acetaminophen (TYLENOL) 325 MG tablet Take 2 tablets (650 mg total) by mouth every 6 (six) hours as needed (for pain scale < 4). 05/12/19   Fair, Hoyle Sauer, MD  albuterol (VENTOLIN HFA) 108 (90 Base) MCG/ACT inhaler Inhale 1-2 puffs into the lungs every 6 (six) hours as needed for wheezing or shortness of breath. 04/17/19   [place] Bing, MD  amoxicillin (AMOXIL) 875 MG tablet Take 1 tablet (875 mg total) by mouth 2 (two) times daily. 09/05/20   Petrucelli, Samantha R, PA-C  ferrous sulfate 325 (65 FE) MG tablet Take 1 tablet (325 mg total) by mouth every other day. Patient not taking: Reported on 06/06/2019 05/12/19   Joselyn Arrow, MD  fluticasone [facility]) 50 MCG/ACT nasal spray Place 1 spray into both nostrils daily. 09/05/20   Petrucelli, Samantha R, PA-C  guaifenesin (ROBITUSSIN) 100 MG/5ML syrup  Take 200 mg by mouth 3 (three) times daily as needed for cough.    [provider]  metroNIDAZOLE (FLAGYL) 500 MG tablet Take 1 tablet (500 mg total) by mouth 2 (two) times daily. 04/13/21   Cristina Gong, PA-C      Allergies    Patient has no known allergies.    Review of Systems   Review of Systems  Constitutional:  Positive for chills and fever.  HENT:  Positive for sore throat.   Respiratory:  Negative for shortness of breath.   Cardiovascular:  Negative for chest pain.  Gastrointestinal:  Negative for abdominal pain, nausea and vomiting.    Physical Exam Updated Vital Signs BP 114/63 (BP Location: Left Arm)   Pulse 75   Temp 99.8 F (37.7 C) (Oral)   Resp 16   SpO2 98%  Physical Exam Vitals and nursing note reviewed.  Constitutional:      Appearance: Normal appearance. She is not ill-appearing.  HENT:     Head: Normocephalic and atraumatic.     Mouth/Throat:     Mouth: Mucous membranes are moist.     Pharynx: Uvula midline. Oropharyngeal exudate and posterior oropharyngeal erythema present.     Tonsils: Tonsillar exudate present. No tonsillar abscesses. 1+ on the right. 2+ on the left.     Comments: Erythema, bilateral tonsillar exudates.  Significant swelling left greater than right.  Tolerating her secretions without any trismus. Eyes:     Pupils: Pupils are equal, round, and reactive  to light.  Pulmonary:     Effort: Pulmonary effort is normal.     Breath sounds: No wheezing or rales.  Abdominal:     General: Abdomen is flat.  Musculoskeletal:     Cervical back: Normal range of motion and neck supple.  Skin:    General: Skin is warm and dry.  Neurological:     Mental Status: She is alert and oriented to person, place, and time.     ED Results / Procedures / Treatments   Labs (all labs ordered are listed, but only abnormal results are displayed) Labs Reviewed  SARS CORONAVIRUS 2 BY RT PCR  GROUP A STREP BY PCR  RESP PANEL BY RT-PCR (FLU  A&B, COVID) [id number]  MONONUCLEOSIS SCREEN    EKG None  Radiology No results found.  Procedures Procedures    Medications Ordered in ED Medications  acetaminophen (TYLENOL) tablet 1,000 mg (1,000 mg Oral Given 12/29/21 1537)  penicillin g benzathine (BICILLIN LA) 1200000 UNIT/2ML injection 1.2 Million Units (1.2 Million Units Intramuscular Given 12/29/21 1537)    ED Course/ Medical Decision Making/ A&P                           Medical Decision Making Amount and/or Complexity of Data Reviewed Labs: ordered.  Risk Prescription drug management.   Patient here with a chief complaint of sore throat, body aches, fatigue for the past 3 days.  Patient did have a similar symptoms which began around 3 weeks ago.  She her symptoms somewhat improved however now they have worsened.  She is tried multiple over-the-counter medications without any improvement in her symptoms.  She is febrile with a temperature of 102 at home temperature here about 100.5.  Vitals are stable, she is tolerating her secretions adequately.  Visualization of her oropharynx appears with surrounding erythema, left tonsil greater than right tonsil 2+ versus 1+.  Multiple exudates noted but uvula is midline.  I do not suspect PTA at this time, suspect likely concerned with strep pharyngitis.  She is endorsing fatigue and ongoing symptoms, we will send a mono spot on patient.  Given Bicillin 1.2 units to help with treatment.  She did have a COVID-19 test which was negative, her strep pharyngitis test was also negative however clinically I am concerned for this the most.  Monospot has been sent out for patient, we discussed following up with the results via MyChart.  She was treated on today's visit with injection of Bicillin, monitor for 30 minutes afterwards with no allergic reaction noted.  Patient is hemodynamically stable for discharge at this time.   Portions of this note were generated with Scientist, clinical (histocompatibility and immunogenetics).  Dictation errors may occur despite best attempts at proofreading.   Final Clinical Impression(s) / ED Diagnoses Final diagnoses:  Sore throat  Pharyngitis, unspecified etiology    Rx / DC Orders ED Discharge Orders     None         Claude Manges, PA-C 12/29/21 1603    Lonell Grandchild, MD 12/29/21 2045

## 2021-12-29 NOTE — ED Provider Triage Note (Signed)
Emergency Medicine Provider Triage Evaluation Note  Brianna Bender , a 21 y.o. female  was evaluated in triage for chills, congestion, generalized body aches, fatigue, and sore throat for the last week worsening for the last 3 days. Throat pain worse with drinking and eating but able to swallow despite pain. Hx asthma with last flare in August but before that several years ago. Now not taking medications.  Has tried DayQuil, NyQuil, Robitussin, Emergen-C, Mucinex, Theraflu. No sick contacts.   Review of Systems  Positive: chills, body aches, sore throat,  Negative: SOB, chest pain, fever at home, cough  Physical Exam  BP 119/74 (BP Location: Right Arm)   Pulse 97   Temp (!) 100.5 F (38.1 C) (Oral)   Resp 16   SpO2 100%  Gen:   Awake, no distress   Resp:  Normal effort, clear lungs MSK:   Moves extremities without difficulty  Other:  Left tonsillar moderate swelling, erythema, and white exudate, patent airway, heart RRR  Medical Decision Making  Medically screening exam initiated at 8:39 AM.  Appropriate orders placed.  Berneda Piccininni was informed that the remainder of the evaluation will be completed by another provider, this initial triage assessment does not replace that evaluation, and the importance of remaining in the ED until their evaluation is complete.  Orders for COVID/respiratory panel, influenza, and strep placed   Tonette Lederer, PA-C 12/29/21 9532

## 2021-12-31 ENCOUNTER — Encounter (HOSPITAL_COMMUNITY): Payer: Self-pay

## 2021-12-31 ENCOUNTER — Emergency Department (HOSPITAL_COMMUNITY)
Admission: EM | Admit: 2021-12-31 | Discharge: 2021-12-31 | Disposition: A | Discharge status: Home / Self Care | Payer: Medicaid Other | Attending: Emergency Medicine | Admitting: Emergency Medicine

## 2021-12-31 ENCOUNTER — Other Ambulatory Visit: Payer: Self-pay

## 2021-12-31 DIAGNOSIS — J039 Acute tonsillitis, unspecified: Secondary | ICD-10-CM | POA: Insufficient documentation

## 2021-12-31 MED ORDER — LIDOCAINE VISCOUS HCL 2 % MT SOLN: 15.0000 mL | OROMUCOSAL | 0 refills | Status: AC | PRN

## 2021-12-31 MED ORDER — DEXAMETHASONE SODIUM PHOSPHATE 10 MG/ML IJ SOLN
10.0000 mg | Freq: Once | INTRAMUSCULAR | Status: AC
  Administered 2021-12-31: 10 mg via INTRAMUSCULAR
  Filled 2021-12-31: qty 1

## 2021-12-31 MED ORDER — LIDOCAINE VISCOUS HCL 2 % MT SOLN
15.0000 mL | Freq: Once | OROMUCOSAL | Status: AC
  Administered 2021-12-31: 15 mL via OROMUCOSAL
  Filled 2021-12-31: qty 15

## 2021-12-31 MED ORDER — ACETAMINOPHEN 160 MG/5ML PO SOLN
650.0000 mg | Freq: Once | ORAL | Status: AC
  Administered 2021-12-31: 650 mg via ORAL
  Filled 2021-12-31: qty 20.3

## 2021-12-31 NOTE — ED Triage Notes (Addendum)
Patient said for a week she has had a sore throat. She said she got a penicillin shot 2 days ago. Tested negative for strep and mono. Said her ears hurt now. White patches on her tonsils.

## 2021-12-31 NOTE — ED Provider Notes (Signed)
[facility]-EMERGENCY DEPT Provider Note   CSN: [id number] Arrival date & time: 12/31/21  1615     History  Chief Complaint  Patient presents with   Sore Throat         Brianna Bender is a 21 y.o. female presenting with  1-2 weeks of sore throat.  She reports that this has been ongoing and was seen in the emergency department 3 days ago and treated with penicillin shot.  She had negative strep, mono and COVID and flu testing at that time.  She continues to have inflammation in her throat and pain with swallowing.  Is able to eat, drink and take an ibuprofen.  She says that her fever has been as high as 103.   Sore Throat       Home Medications Prior to Admission medications   Medication Sig Start Date End Date Taking? Authorizing Provider  lidocaine (XYLOCAINE) 2 % solution Use as directed 15 mLs in the mouth or throat as needed for mouth pain. 12/31/21  Yes Martavius Lusty A, PA-C  acetaminophen (TYLENOL) 325 MG tablet Take 2 tablets (650 mg total) by mouth every 6 (six) hours as needed (for pain scale < 4). 05/12/19   Fair, Hoyle Sauer, MD  albuterol (VENTOLIN HFA) 108 (90 Base) MCG/ACT inhaler Inhale 1-2 puffs into the lungs every 6 (six) hours as needed for wheezing or shortness of breath. 04/17/19   Haring Bing, MD  amoxicillin (AMOXIL) 875 MG tablet Take 1 tablet (875 mg total) by mouth 2 (two) times daily. 09/05/20   Petrucelli, Samantha R, PA-C  ferrous sulfate 325 (65 FE) MG tablet Take 1 tablet (325 mg total) by mouth every other day. Patient not taking: Reported on 06/06/2019 05/12/19   Joselyn Arrow, MD  fluticasone [facility]) 50 MCG/ACT nasal spray Place 1 spray into both nostrils daily. 09/05/20   Petrucelli, Samantha R, PA-C  guaifenesin (ROBITUSSIN) 100 MG/5ML syrup Take 200 mg by mouth 3 (three) times daily as needed for cough.    [provider]  metroNIDAZOLE (FLAGYL) 500 MG tablet Take 1 tablet (500 mg total) by mouth 2 (two) times  daily. 04/13/21   Cristina Gong, PA-C      Allergies    Patient has no known allergies.    Review of Systems   Review of Systems  Physical Exam Updated Vital Signs BP (!) 97/55 (BP Location: Left Arm)   Pulse 98   Temp (!) 102.4 F (39.1 C) (Oral)   Resp 17   SpO2 98%  Physical Exam Vitals and nursing note reviewed.  Constitutional:      General: She is not in acute distress.    Appearance: Normal appearance. She is not ill-appearing.  HENT:     Head: Normocephalic and atraumatic.     Mouth/Throat:     Mouth: Mucous membranes are moist.     Pharynx: Oropharynx is clear.     Tonsils: Tonsillar exudate present. 2+ on the right. 3+ on the left.  Eyes:     General: No scleral icterus.    Conjunctiva/sclera: Conjunctivae normal.  Cardiovascular:     Rate and Rhythm: Normal rate and regular rhythm.  Pulmonary:     Effort: Pulmonary effort is normal. No respiratory distress.  Skin:    Findings: No rash.  Neurological:     Mental Status: She is alert.  Psychiatric:        Mood and Affect: Mood normal.     ED Results /  Procedures / Treatments   Labs (all labs ordered are listed, but only abnormal results are displayed) Labs Reviewed - No data to display  EKG None  Radiology No results found.  Procedures Procedures   Medications Ordered in ED Medications  acetaminophen (TYLENOL) 160 MG/5ML solution 650 mg (650 mg Oral Given 12/31/21 1715)  lidocaine (XYLOCAINE) 2 % viscous mouth solution 15 mL (15 mLs Mouth/Throat Given 12/31/21 1715)  dexamethasone (DECADRON) injection 10 mg (10 mg Intramuscular Given 12/31/21 1714)    ED Course/ Medical Decision Making/ A&P                           Medical Decision Making Risk OTC drugs. Prescription drug management.   22 year old female presenting with patient.  Differential includes but is not limited to strep pharyngitis, mono, PTA, RPA, tonsillar stone or mass.  This is not an exhaustive differential.     Additional history: Reviewed visit from 3 days ago   Physical Exam: Pertinent physical exam findings include 3+ tonsillar swelling and exudate.  Tolerating secretions, airway clear.  Lab Tests: Considered but do not believe it would change the treatment at this time.   Medications: I ordered medication including Decadron lidocaine and tylenol.    MDM/Disposition: This is a 21 year old female presenting with a sore throat.  Was recently seen for this and treated with penicillin.  Continues to have significant tonsillar swelling and exudate.  I reviewed her previous chart and COVID, flu, mono and strep are all negative.  She was febrile and was treated with Tylenol.  He originally had soft blood pressures and I considered a sepsis work-up but patient is otherwise well in appearance.  Not tachycardic.  Do not believe we need to have a full work-up at this time however strict return precautions were given.  No signs of PTA/RPA.  She was also given a referral to ENT.  She is agreeable to discharge.  Decadron, lidocaine and tylenol.   Final Clinical Impression(s) / ED Diagnoses Final diagnoses:  Tonsillitis    Rx / DC Orders ED Discharge Orders          Ordered    lidocaine (XYLOCAINE) 2 % solution  As needed        12/31/21 1654           Results and diagnoses were explained to the patient. Return precautions discussed in full. Patient had no additional questions and expressed complete understanding.   This chart was dictated using voice recognition software.  Despite best efforts to proofread,  errors can occur which can change the documentation meaning.    Woodroe Chen 12/31/21 Harl Bowie, MD 01/03/22 [phone]

## 2021-12-31 NOTE — Discharge Instructions (Addendum)
You were treated with liquid Tylenol, lidocaine and a steroid injection today.  I have sent the lidocaine to your pharmacy.  As we discussed you should not eat or drink anything warm for 30 minutes after you use the lidocaine.  There is an ENT doctor on these papers that you may follow-up with.  Return to the emergency department with any worsening symptoms.  It was a pleasure to meet you and we hope you feel better!

## 2022-10-13 ENCOUNTER — Telehealth: Payer: Medicaid Other

## 2022-10-24 ENCOUNTER — Emergency Department (HOSPITAL_COMMUNITY)
Admission: EM | Admit: 2022-10-24 | Discharge: 2022-10-25 | Discharge status: Against Medical Advice | Payer: Medicaid Other | Source: Home / Self Care

## 2022-11-09 ENCOUNTER — Encounter: Payer: Medicaid Other | Admitting: Obstetrics & Gynecology

## 2022-11-09 ENCOUNTER — Encounter: Payer: Self-pay | Admitting: Obstetrics & Gynecology

## 2022-11-28 ENCOUNTER — Telehealth: Payer: Medicaid Other

## 2022-12-10 ENCOUNTER — Ambulatory Visit (HOSPITAL_COMMUNITY)
Admission: EM | Admit: 2022-12-10 | Discharge: 2022-12-10 | Discharge status: Against Medical Advice | Payer: Medicaid Other

## 2022-12-10 DIAGNOSIS — N760 Acute vaginitis: Secondary | ICD-10-CM | POA: Diagnosis not present

## 2022-12-10 DIAGNOSIS — Z113 Encounter for screening for infections with a predominantly sexual mode of transmission: Secondary | ICD-10-CM | POA: Diagnosis not present

## 2022-12-10 DIAGNOSIS — N390 Urinary tract infection, site not specified: Secondary | ICD-10-CM | POA: Diagnosis not present

## 2022-12-10 NOTE — ED Notes (Signed)
Informed by the front desk that Patient LWBS before triage.

## 2023-01-03 ENCOUNTER — Emergency Department (HOSPITAL_BASED_OUTPATIENT_CLINIC_OR_DEPARTMENT_OTHER)
Admission: EM | Admit: 2023-01-03 | Discharge: 2023-01-03 | Disposition: A | Discharge status: Home / Self Care | Payer: Medicaid Other | Attending: Emergency Medicine | Admitting: Emergency Medicine

## 2023-01-03 ENCOUNTER — Emergency Department (HOSPITAL_BASED_OUTPATIENT_CLINIC_OR_DEPARTMENT_OTHER): Payer: Medicaid Other | Admitting: Radiology

## 2023-01-03 ENCOUNTER — Encounter (HOSPITAL_BASED_OUTPATIENT_CLINIC_OR_DEPARTMENT_OTHER): Payer: Self-pay

## 2023-01-03 ENCOUNTER — Other Ambulatory Visit: Payer: Self-pay

## 2023-01-03 DIAGNOSIS — R001 Bradycardia, unspecified: Secondary | ICD-10-CM | POA: Diagnosis not present

## 2023-01-03 DIAGNOSIS — M533 Sacrococcygeal disorders, not elsewhere classified: Secondary | ICD-10-CM | POA: Insufficient documentation

## 2023-01-03 DIAGNOSIS — I878 Other specified disorders of veins: Secondary | ICD-10-CM | POA: Diagnosis not present

## 2023-01-03 DIAGNOSIS — R1031 Right lower quadrant pain: Secondary | ICD-10-CM | POA: Diagnosis not present

## 2023-01-03 DIAGNOSIS — M549 Dorsalgia, unspecified: Secondary | ICD-10-CM | POA: Diagnosis not present

## 2023-01-03 DIAGNOSIS — M25551 Pain in right hip: Secondary | ICD-10-CM | POA: Diagnosis not present

## 2023-01-03 LAB — PREGNANCY, URINE: Preg Test, Ur: NEGATIVE

## 2023-01-03 MED ORDER — ONDANSETRON 4 MG PO TBDP
4.0000 mg | ORAL_TABLET | Freq: Once | ORAL | Status: AC
Start: 2023-01-03 — End: 2023-01-03
  Administered 2023-01-03: 4 mg via ORAL
  Filled 2023-01-03: qty 1

## 2023-01-03 MED ORDER — IBUPROFEN 600 MG PO TABS: 600.0000 mg | ORAL_TABLET | Freq: Four times a day (QID) | ORAL | 0 refills | Status: AC | PRN

## 2023-01-03 MED ORDER — OXYCODONE HCL 5 MG PO TABS
5.0000 mg | ORAL_TABLET | Freq: Once | ORAL | Status: AC
  Administered 2023-01-03: 5 mg via ORAL
  Filled 2023-01-03: qty 1

## 2023-01-03 MED ORDER — KETOROLAC TROMETHAMINE 30 MG/ML IJ SOLN
30.0000 mg | Freq: Once | INTRAMUSCULAR | Status: AC
  Administered 2023-01-03: 30 mg via INTRAMUSCULAR
  Filled 2023-01-03: qty 1

## 2023-01-03 MED ORDER — PREDNISONE 50 MG PO TABS
60.0000 mg | ORAL_TABLET | Freq: Once | ORAL | Status: AC
  Administered 2023-01-03: 60 mg via ORAL
  Filled 2023-01-03: qty 1

## 2023-01-03 MED ORDER — PREDNISONE 10 MG PO TABS: 40.0000 mg | ORAL_TABLET | Freq: Every day | ORAL | 0 refills | Status: AC

## 2023-01-03 MED ORDER — HYDROCODONE-ACETAMINOPHEN 5-325 MG PO TABS: 1.0000 | ORAL_TABLET | Freq: Three times a day (TID) | ORAL | 0 refills | Status: DC | PRN

## 2023-01-03 MED ORDER — HYDROMORPHONE HCL 1 MG/ML IJ SOLN
1.0000 mg | Freq: Once | INTRAMUSCULAR | Status: AC
  Administered 2023-01-03: 1 mg via INTRAMUSCULAR
  Filled 2023-01-03: qty 1

## 2023-01-03 NOTE — ED Provider Notes (Signed)
Pt signed out by Dr. Renaye Rakers pending Xray results.  Xrays reviewed by me.  I agree with the radiologist.  Lumbar: Negative radiographs of the lumbar spine.   R hip: Negative radiographs of the pelvis and right hip.   Pt is feeling better.  She is stable for d/c.  Return if worse.  F/u with ortho.   Jacalyn Lefevre, MD 01/03/23 1750

## 2023-01-03 NOTE — ED Provider Notes (Signed)
[place] EMERGENCY DEPARTMENT AT [facility] Provider Note   CSN: [id number] Arrival date & time: 01/03/23  1027     History  Chief Complaint  Patient presents with   Hip Pain    Brianna Bender is a 22 y.o. female presented to the ED with right posterior hip pain.  Patient reports that she began having pain in her right lower back and her right hip yesterday, which gradually got worse overnight.  Did not respond well to Tylenol or ibuprofen.  She has the pain is worse with any walking.  She says she was dropping her daughter off at school today and her leg "buckled under me".  She has had this problem in the past, several months ago, with pain in her right posterior hip which got better on its own.  She denies any known history of medical conditions or bone disorder.  She does not see an orthopedic doctor.  She denies any recent trauma preceding her pain  HPI     Home Medications Prior to Admission medications   Medication Sig Start Date End Date Taking? Authorizing Provider  HYDROcodone-acetaminophen (NORCO/VICODIN) 5-325 MG tablet Take 1 tablet by mouth every 8 (eight) hours as needed for up to 12 doses for severe pain (pain score 7-10). 01/03/23  Yes Evett Kassa, Kermit Balo, MD  ibuprofen (ADVIL) 600 MG tablet Take 1 tablet (600 mg total) by mouth every 6 (six) hours as needed for up to 30 doses for mild pain (pain score 1-3) or moderate pain (pain score 4-6). 01/03/23  Yes Liba Hulsey, Kermit Balo, MD  predniSONE (DELTASONE) 10 MG tablet Take 4 tablets (40 mg total) by mouth daily with breakfast for 5 days. 01/03/23 01/08/23 Yes Travus Oren, Kermit Balo, MD  acetaminophen (TYLENOL) 325 MG tablet Take 2 tablets (650 mg total) by mouth every 6 (six) hours as needed (for pain scale < 4). 05/12/19   Fair, Hoyle Sauer, MD  albuterol (VENTOLIN HFA) 108 (90 Base) MCG/ACT inhaler Inhale 1-2 puffs into the lungs every 6 (six) hours as needed for wheezing or shortness of breath. 04/17/19   [place] Bing,  MD  amoxicillin (AMOXIL) 875 MG tablet Take 1 tablet (875 mg total) by mouth 2 (two) times daily. 09/05/20   Petrucelli, Samantha R, PA-C  ferrous sulfate 325 (65 FE) MG tablet Take 1 tablet (325 mg total) by mouth every other day. Patient not taking: Reported on 06/06/2019 05/12/19   Joselyn Arrow, MD  fluticasone [facility]) 50 MCG/ACT nasal spray Place 1 spray into both nostrils daily. 09/05/20   Petrucelli, Samantha R, PA-C  guaifenesin (ROBITUSSIN) 100 MG/5ML syrup Take 200 mg by mouth 3 (three) times daily as needed for cough.    [provider]  lidocaine (XYLOCAINE) 2 % solution Use as directed 15 mLs in the mouth or throat as needed for mouth pain. 12/31/21   Redwine, Madison A, PA-C  metroNIDAZOLE (FLAGYL) 500 MG tablet Take 1 tablet (500 mg total) by mouth 2 (two) times daily. 04/13/21   Cristina Gong, PA-C      Allergies    Patient has no known allergies.    Review of Systems   Review of Systems  Physical Exam Updated Vital Signs BP 125/83   Pulse 69   Temp 98.3 F (36.8 C) (Oral)   Resp 16   Ht 5\' 2"  (1.575 m)   Wt 75.8 kg   LMP 12/30/2022 (Exact Date)   SpO2 100%   BMI 30.54 kg/m  Physical Exam Constitutional:  General: She is not in acute distress. HENT:     Head: Normocephalic and atraumatic.  Eyes:     Conjunctiva/sclera: Conjunctivae normal.     Pupils: Pupils are equal, round, and reactive to light.  Cardiovascular:     Rate and Rhythm: Normal rate and regular rhythm.  Pulmonary:     Effort: Pulmonary effort is normal. No respiratory distress.  Abdominal:     General: There is no distension.     Tenderness: There is no abdominal tenderness.  Musculoskeletal:     Comments: Isolated bony tenderness near the sacroiliac joint on the right pelvis, patient is able to actively and passively extend her hips, no visible shortening or eversion of the hips  Skin:    General: Skin is warm and dry.  Neurological:     General: No focal deficit  present.     Mental Status: She is alert. Mental status is at baseline.  Psychiatric:        Mood and Affect: Mood normal.        Behavior: Behavior normal.     ED Results / Procedures / Treatments   Labs (all labs ordered are listed, but only abnormal results are displayed) Labs Reviewed  PREGNANCY, URINE    EKG None  Radiology No results found.  Procedures Procedures    Medications Ordered in ED Medications  HYDROmorphone (DILAUDID) injection 1 mg (has no administration in time range)  oxyCODONE (Oxy IR/ROXICODONE) immediate release tablet 5 mg (5 mg Oral Given 01/03/23 1328)  predniSONE (DELTASONE) tablet 60 mg (60 mg Oral Given 01/03/23 1327)  ketorolac (TORADOL) 30 MG/ML injection 30 mg (30 mg Intramuscular Given 01/03/23 1327)    ED Course/ Medical Decision Making/ A&P                                 Medical Decision Making Amount and/or Complexity of Data Reviewed Labs: ordered. Radiology: ordered.  Risk Prescription drug management.   Patient is here with suspected musculoskeletal pain or potential radiculopathy as well, likely related to sacroiliitis or sciatica type pain.  X-rays are ordered pending at the time of signout at 4 pm to Dr Jacalyn Lefevre EDP.  IM and oral pain medications were given in the ED.  She will need follow-up with orthopedic doctor, may benefit from localized injections if she has persistent pain.  She can be weightbearing as tolerated but we discussed the temporary use of crutches as she is reporting significant pain with bearing weight on her right leg.  I have a low suspicion for cauda equina syndrome, acute DVT, or intra-abdominal pathology, or pelvic pathology as a cause of her symptoms. Her pain is readily reproducible with bony palpation superficially and with weightbearing.        Final Clinical Impression(s) / ED Diagnoses Final diagnoses:  Sacro-iliac pain    Rx / DC Orders ED Discharge Orders          Ordered     HYDROcodone-acetaminophen (NORCO/VICODIN) 5-325 MG tablet  Every 8 hours PRN        01/03/23 1559    ibuprofen (ADVIL) 600 MG tablet  Every 6 hours PRN        01/03/23 1559    predniSONE (DELTASONE) 10 MG tablet  Daily with breakfast        01/03/23 1559              Natosha Bou, Kermit Balo, MD  01/03/23 1615  

## 2023-01-03 NOTE — ED Triage Notes (Signed)
EMS report - PTAR, transported from work for intermittent right hip and back pain. Sudden onset yesterday. Denies injury.

## 2023-01-05 ENCOUNTER — Telehealth: Payer: Medicaid Other | Admitting: Emergency Medicine

## 2023-01-05 DIAGNOSIS — M533 Sacrococcygeal disorders, not elsewhere classified: Secondary | ICD-10-CM

## 2023-01-05 NOTE — Patient Instructions (Signed)
Brianna Bender, thank you for joining Roxy Horseman, PA-C for today's virtual visit.  While this provider is not your primary care provider (PCP), if your PCP is located in our provider database this encounter information will be shared with them immediately following your visit.   A [facility] MyChart account gives you access to today's visit and all your visits, tests, and labs performed at [facility] " click here if you don't have a [facility] MyChart account or go to [email or web address]  Consent: (Patient) Brianna Bender provided verbal consent for this virtual visit at the beginning of the encounter.  Current Medications:  Current Outpatient Medications:    acetaminophen (TYLENOL) 325 MG tablet, Take 2 tablets (650 mg total) by mouth every 6 (six) hours as needed (for pain scale < 4)., Disp: 30 tablet, Rfl: 0   albuterol (VENTOLIN HFA) 108 (90 Base) MCG/ACT inhaler, Inhale 1-2 puffs into the lungs every 6 (six) hours as needed for wheezing or shortness of breath., Disp: 8 g, Rfl: 1   amoxicillin (AMOXIL) 875 MG tablet, Take 1 tablet (875 mg total) by mouth 2 (two) times daily., Disp: 14 tablet, Rfl: 0   ferrous sulfate 325 (65 FE) MG tablet, Take 1 tablet (325 mg total) by mouth every other day. (Patient not taking: Reported on 06/06/2019), Disp: 30 tablet, Rfl: 0   fluticasone (FLONASE) 50 MCG/ACT nasal spray, Place 1 spray into both nostrils daily., Disp: 16 g, Rfl: 0   guaifenesin (ROBITUSSIN) 100 MG/5ML syrup, Take 200 mg by mouth 3 (three) times daily as needed for cough., Disp: , Rfl:    HYDROcodone-acetaminophen (NORCO/VICODIN) 5-325 MG tablet, Take 1 tablet by mouth every 8 (eight) hours as needed for up to 12 doses for severe pain (pain score 7-10)., Disp: 12 tablet, Rfl: 0   ibuprofen (ADVIL) 600 MG tablet, Take 1 tablet (600 mg total) by mouth every 6 (six) hours as needed for up to 30 doses for mild pain (pain score 1-3) or moderate pain (pain score 4-6)., Disp:  30 tablet, Rfl: 0   lidocaine (XYLOCAINE) 2 % solution, Use as directed 15 mLs in the mouth or throat as needed for mouth pain., Disp: 100 mL, Rfl: 0   metroNIDAZOLE (FLAGYL) 500 MG tablet, Take 1 tablet (500 mg total) by mouth 2 (two) times daily., Disp: 14 tablet, Rfl: 0   predniSONE (DELTASONE) 10 MG tablet, Take 4 tablets (40 mg total) by mouth daily with breakfast for 5 days., Disp: 20 tablet, Rfl: 0   Medications ordered in this encounter:  No orders of the defined types were placed in this encounter.    *If you need refills on other medications prior to your next appointment, please contact your pharmacy*  Follow-Up: Call back or seek an in-person evaluation if the symptoms worsen or if the condition fails to improve as anticipated.  [facility] [phone]  Other Instructions    If you have been instructed to have an in-person evaluation today at a local Urgent Care facility, please use the link below. It will take you to a list of all of our available [facility], including address, phone number and hours of operation. Please do not delay care.  [facility]  If you or a family member do not have a primary care provider, use the link below to schedule a visit and establish care. When you choose a [facility] primary care physician or advanced practice provider, you gain a long-term  partner in health. Find a Primary Care Provider  Learn more about [facility] in-office and virtual care options: [facility] - Get Care Now

## 2023-01-05 NOTE — Progress Notes (Signed)
Virtual Visit Consent   Brianna Bender, you are scheduled for a virtual visit with a [facility] provider today. Just as with appointments in the office, your consent must be obtained to participate. Your consent will be active for this visit and any virtual visit you may have with one of our providers in the next 365 days. If you have a MyChart account, a copy of this consent can be sent to you electronically.  As this is a virtual visit, video technology does not allow for your provider to perform a traditional examination. This may limit your provider's ability to fully assess your condition. If your provider identifies any concerns that need to be evaluated in person or the need to arrange testing (such as labs, EKG, etc.), we will make arrangements to do so. Although advances in technology are sophisticated, we cannot ensure that it will always work on either your end or our end. If the connection with a video visit is poor, the visit may have to be switched to a telephone visit. With either a video or telephone visit, we are not always able to ensure that we have a secure connection.  By engaging in this virtual visit, you consent to the provision of healthcare and authorize for your insurance to be billed (if applicable) for the services provided during this visit. Depending on your insurance coverage, you may receive a charge related to this service.  I need to obtain your verbal consent now. Are you willing to proceed with your visit today? Kathileen Berninger has provided verbal consent on 01/05/2023 for a virtual visit (video or telephone). Roxy Horseman, PA-C  Date: 01/05/2023 11:41 AM  Virtual Visit via Video Note   I, Roxy Horseman, connected with  Brianna Bender  ([id number], 2000/09/09) on 01/05/23 at 11:30 AM EST by a video-enabled telemedicine application and verified that I am speaking with the correct person using two identifiers.  Location: Patient: Virtual Visit Location Patient:  Home Provider: Virtual Visit Location Provider: Home Office   I discussed the limitations of evaluation and management by telemedicine and the availability of in person appointments. The patient expressed understanding and agreed to proceed.    History of Present Illness: Brianna Bender is a 22 y.o. who identifies as a female who was assigned female at birth, and is being seen today for SI joint pain.  Was diagnosed with sacro-iliac pain 2 days ago in the ER.  She is using crutches, but states that she needs a note for work since she works in [employer] and can't fulfill her duties using the crutches and due to pain.  No other new symptoms.  Overall, she states that her symptoms are improving.  She is working on getting an appointment with ortho as directed from the ER.  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   Suicidal ideation 11/18/2016   Asthma exacerbation 05/06/2013    Allergies: No Known Allergies Medications:  Current Outpatient Medications:    acetaminophen (TYLENOL) 325 MG tablet, Take 2 tablets (650 mg total) by  mouth every 6 (six) hours as needed (for pain scale < 4)., Disp: 30 tablet, Rfl: 0   albuterol (VENTOLIN HFA) 108 (90 Base) MCG/ACT inhaler, Inhale 1-2 puffs into the lungs every 6 (six) hours as needed for wheezing or shortness of breath., Disp: 8 g, Rfl: 1   amoxicillin (AMOXIL) 875 MG tablet, Take 1 tablet (875 mg total) by mouth 2 (two) times daily., Disp: 14 tablet, Rfl: 0    ferrous sulfate 325 (65 FE) MG tablet, Take 1 tablet (325 mg total) by mouth every other day. (Patient not taking: Reported on 06/06/2019), Disp: 30 tablet, Rfl: 0   fluticasone (FLONASE) 50 MCG/ACT nasal spray, Place 1 spray into both nostrils daily., Disp: 16 g, Rfl: 0   guaifenesin (ROBITUSSIN) 100 MG/5ML syrup, Take 200 mg by mouth 3 (three) times daily as needed for cough., Disp: , Rfl:    HYDROcodone-acetaminophen (NORCO/VICODIN) 5-325 MG tablet, Take 1 tablet by mouth every 8 (eight) hours as needed for up to 12 doses for severe pain (pain score 7-10)., Disp: 12 tablet, Rfl: 0   ibuprofen (ADVIL) 600 MG tablet, Take 1 tablet (600 mg total) by mouth every 6 (six) hours as needed for up to 30 doses for mild pain (pain score 1-3) or moderate pain (pain score 4-6)., Disp: 30 tablet, Rfl: 0   lidocaine (XYLOCAINE) 2 % solution, Use as directed 15 mLs in the mouth or throat as needed for mouth pain., Disp: 100 mL, Rfl: 0   metroNIDAZOLE (FLAGYL) 500 MG tablet, Take 1 tablet (500 mg total) by mouth 2 (two) times daily., Disp: 14 tablet, Rfl: 0   predniSONE (DELTASONE) 10 MG tablet, Take 4 tablets (40 mg total) by mouth daily with breakfast for 5 days., Disp: 20 tablet, Rfl: 0  Observations/Objective: AV failed with caregility.  This was a telephone encounter. No audible distress Speech is clear, thought coherent No labored breathing  Assessment and Plan: 1. Sacro-iliac pain    Continue prescribed medications.  Stick to plan of ortho follow-up.  Continue to use crutches until cleared by ortho.  Follow Up Instructions: I discussed the assessment and treatment plan with the patient. The patient was provided an opportunity to ask questions and all were answered. The patient agreed with the plan and demonstrated an understanding of the instructions.  A copy of instructions were sent to the patient via MyChart unless otherwise noted below.     The patient was advised to call back or seek an  in-person evaluation if the symptoms worsen or if the condition fails to improve as anticipated.    Roxy Horseman, PA-C

## 2023-01-06 DIAGNOSIS — M259 Joint disorder, unspecified: Secondary | ICD-10-CM | POA: Diagnosis not present

## 2023-01-06 DIAGNOSIS — M533 Sacrococcygeal disorders, not elsewhere classified: Secondary | ICD-10-CM | POA: Diagnosis not present

## 2023-01-06 DIAGNOSIS — M545 Low back pain, unspecified: Secondary | ICD-10-CM | POA: Diagnosis not present

## 2023-01-09 ENCOUNTER — Other Ambulatory Visit: Payer: Self-pay | Admitting: Orthopedic Surgery

## 2023-01-09 DIAGNOSIS — M533 Sacrococcygeal disorders, not elsewhere classified: Secondary | ICD-10-CM

## 2023-02-01 ENCOUNTER — Other Ambulatory Visit: Payer: Medicaid Other

## 2023-02-01 ENCOUNTER — Inpatient Hospital Stay: Admission: RE | Admit: 2023-02-01 | Payer: Medicaid Other | Source: Ambulatory Visit

## 2023-03-19 ENCOUNTER — Emergency Department (HOSPITAL_COMMUNITY)
Admission: EM | Admit: 2023-03-19 | Discharge: 2023-03-19 | Disposition: A | Discharge status: Home / Self Care | Payer: Medicaid Other | Attending: Emergency Medicine | Admitting: Emergency Medicine

## 2023-03-19 ENCOUNTER — Other Ambulatory Visit: Payer: Self-pay

## 2023-03-19 ENCOUNTER — Encounter (HOSPITAL_COMMUNITY): Payer: Self-pay

## 2023-03-19 DIAGNOSIS — N764 Abscess of vulva: Secondary | ICD-10-CM | POA: Insufficient documentation

## 2023-03-19 DIAGNOSIS — L0231 Cutaneous abscess of buttock: Secondary | ICD-10-CM | POA: Diagnosis not present

## 2023-03-19 MED ORDER — DOXYCYCLINE HYCLATE 100 MG PO TABS
100.0000 mg | ORAL_TABLET | Freq: Once | ORAL | Status: AC
  Administered 2023-03-19: 100 mg via ORAL
  Filled 2023-03-19: qty 1

## 2023-03-19 MED ORDER — DOXYCYCLINE HYCLATE 100 MG PO CAPS: 100.0000 mg | ORAL_CAPSULE | Freq: Two times a day (BID) | ORAL | 0 refills | Status: AC

## 2023-03-19 MED ORDER — ACETAMINOPHEN 500 MG PO TABS
1000.0000 mg | ORAL_TABLET | Freq: Once | ORAL | Status: AC
  Administered 2023-03-19: 1000 mg via ORAL
  Filled 2023-03-19: qty 2

## 2023-03-19 MED ORDER — LIDOCAINE HCL (PF) 1 % IJ SOLN
10.0000 mL | Freq: Once | INTRAMUSCULAR | Status: AC
  Administered 2023-03-19: 10 mL
  Filled 2023-03-19: qty 30

## 2023-03-19 NOTE — ED Triage Notes (Signed)
Patient reports she shaved her vaginal area 2 weeks ago. Since she has had a growing abscess. Reports she now has another abscess on her bottom. Denies fevers.

## 2023-03-19 NOTE — Discharge Instructions (Addendum)
Your abscess was attempted to be drained here. I was unable to get to the pocket of pus today.  There is a small laceration (cut) on your labia.  This will heal on its own. You may gently clean the area around your laceration as needed with soap and water.   Please keep the area clean with gentle soap and water.  Please apply warm compresses to your labia and buttock 3-4 times a day for 10 minutes at a time to help promote drainage.  Do not submerge your laceration in water (no baths, swimming) until it is fully healed (about 5 days). You may shower.   Take 600mg  ibuprofen, then 3 hours later take 1000mg  tylenol, then 3 hours later 600mg  ibuprofen, then 3 hours later 1000mg  tylenol, so on and so forth for pain control.  You were given your first dose of Tylenol here today.  You have been prescribed doxycycline. Take this antibiotic 2 times a day for the next 7 days. Take the full course of your antibiotic even if you start feeling better. Antibiotics may cause you to have diarrhea.   If abscesses become very large, but do not start to drain on their own within the next couple of days, please return to the ER for further drainage.  Return to the ER should you develop fever, chills, pus drainage from your wound, redness around your wound.

## 2023-03-19 NOTE — ED Provider Notes (Signed)
[place] EMERGENCY DEPARTMENT AT [facility] Provider Note   CSN: [id number] Arrival date & time: 03/19/23  1655     History  Chief Complaint  Patient presents with   Abscess    Brianna Bender is a 23 y.o. female with no significant past medical history who presents with concern for a painful area on her left labia as well as left buttock for the past 3 days.  Denies any drainage from the area.  States that is painful to sit.  Reports she shaved the area which may have caused this to come on.  Denies any fever or chills.   Abscess Associated symptoms: no fever        Home Medications Prior to Admission medications   Medication Sig Start Date End Date Taking? Authorizing Provider  doxycycline (VIBRAMYCIN) 100 MG capsule Take 1 capsule (100 mg total) by mouth 2 (two) times daily for 7 days. 03/19/23 03/26/23 Yes Arabella Merles, PA-C  acetaminophen (TYLENOL) 325 MG tablet Take 2 tablets (650 mg total) by mouth every 6 (six) hours as needed (for pain scale < 4). 05/12/19   Fair, Hoyle Sauer, MD  albuterol (VENTOLIN HFA) 108 (90 Base) MCG/ACT inhaler Inhale 1-2 puffs into the lungs every 6 (six) hours as needed for wheezing or shortness of breath. 04/17/19   Ravenna Bing, MD  ferrous sulfate 325 (65 FE) MG tablet Take 1 tablet (325 mg total) by mouth every other day. Patient not taking: Reported on 06/06/2019 05/12/19   Joselyn Arrow, MD  guaifenesin (ROBITUSSIN) 100 MG/5ML syrup Take 200 mg by mouth 3 (three) times daily as needed for cough.    [provider]  ibuprofen (ADVIL) 600 MG tablet Take 1 tablet (600 mg total) by mouth every 6 (six) hours as needed for up to 30 doses for mild pain (pain score 1-3) or moderate pain (pain score 4-6). 01/03/23   Terald Sleeper, MD  lidocaine (XYLOCAINE) 2 % solution Use as directed 15 mLs in the mouth or throat as needed for mouth pain. 12/31/21   Redwine, Madison A, PA-C      Allergies    Patient has no known allergies.     Review of Systems   Review of Systems  Constitutional:  Negative for fever.  Skin:  Positive for color change.    Physical Exam Updated Vital Signs BP 135/87 (BP Location: Right Arm)   Pulse 94   Temp 98.3 F (36.8 C) (Oral)   Resp 16   Ht 5\' 2"  (1.575 m)   Wt 75.8 kg   SpO2 100%   BMI 30.56 kg/m  Physical Exam Vitals and nursing note reviewed. Exam conducted with a chaperone present.  Constitutional:      Appearance: Normal appearance.  HENT:     Head: Atraumatic.  Pulmonary:     Effort: Pulmonary effort is normal.  Genitourinary:    Comments: RN Rolly Salter present to chaperone exam  3cm diameter raised firm area on left labia with central area of fluctuance.  No erythema. No drainage from the area.  1 cm diameter raised firm bump on the left buttock.  No areas of fluctuance or drainage.  No erythema. Neurological:     General: No focal deficit present.     Mental Status: She is alert.  Psychiatric:        Mood and Affect: Mood normal.        Behavior: Behavior normal.     ED Results / Procedures /  Treatments   Labs (all labs ordered are listed, but only abnormal results are displayed) Labs Reviewed - No data to display  EKG None  Radiology No results found.  Procedures .Incision and Drainage  Date/Time: 03/19/2023 7:28 PM  Performed by: Arabella Merles, PA-C Authorized by: Arabella Merles, PA-C   Consent:    Consent obtained:  Verbal   Consent given by:  Patient   Risks, benefits, and alternatives were discussed: yes     Risks discussed:  Bleeding, incomplete drainage, pain and infection   Alternatives discussed:  No treatment Universal protocol:    Patient identity confirmed:  Verbally with patient Location:    Type:  Abscess   Size:  3cm   Location:  Anogenital (Left labia) Pre-procedure details:    Skin preparation:  Chlorhexidine with alcohol Sedation:    Sedation type:  None Anesthesia:    Anesthesia method:  Local infiltration    Local anesthetic:  Lidocaine 1% w/o epi (3ml) Procedure details:    Incision types:  Stab incision   Drainage:  Bloody   Drainage amount:  Scant Post-procedure details:    Procedure completion:  Tolerated well, no immediate complications Comments:     Unable to drain pocket of pus. Patient did not want me to attempt drainage from another spot. Marland KitchenUltrasound ED Soft Tissue  Date/Time: 03/19/2023 7:30 PM  Performed by: Arabella Merles, PA-C Authorized by: Arabella Merles, PA-C   Procedure details:    Indications: localization of abscess     Transverse view:  Visualized   Longitudinal view:  Visualized   Images: not archived   Location:    Location comment:  Left labia and left buttock Findings:     abscess present Comments:     Left labial abscess visualized. Bump on left buttock with soft tissue edema but no drain able abscess     Medications Ordered in ED Medications  lidocaine (PF) (XYLOCAINE) 1 % injection 10 mL (10 mLs Infiltration Given by Other 03/19/23 1853)  acetaminophen (TYLENOL) tablet 1,000 mg (1,000 mg Oral Given 03/19/23 1853)  doxycycline (VIBRA-TABS) tablet 100 mg (100 mg Oral Given 03/19/23 1853)    ED Course/ Medical Decision Making/ A&P                                 Medical Decision Making Risk OTC drugs. Prescription drug management.     Differential diagnosis includes but is not limited to folliculitis, cellulitis, abscess  ED Course:  Patient well-appearing, stable vital signs.  Performed GU exam with nurse Rolly Salter at bedside.  Labial abscess was visualized which did have area of fluctuance, but no erythema to suggest infection.  Ultrasound was also used and it did appear that there was a fluid/pus collection that would be drainable on patient's labia.  Ultrasound was used on patient's left buttock, no drainable abscess visualized.  The bump on the patient's buttock is not erythematous, no drainage, no concern for infection at this time. Patient  given Tylenol for pain Given first dose of doxycycline here for her labial abscess Incision and drainage was attempted for the labial abscess but was unsuccessful. No pus was able to be drained and patient did not want me to attempt from another angle. Will have her complete 7-day course of doxycycline at home as well as use warm compresses on the labia and buttock top with drainage.  We discussed that she needs to return if the abscess  does not start to drain on its own within the next 3 to 4 days.    Impression: Left labial abscess Left buttock cyst vs early abscess  Disposition:  The patient was discharged home with instructions to take 7 day course of doxycyline as prescribed.  Tylenol and ibuprofen as needed for pain.  Apply warm compresses to the labial abscess and left buttock pump for 10 to 15 minutes at a time, 3-4 times a day. Return precautions given.                Final Clinical Impression(s) / ED Diagnoses Final diagnoses:  Abscess of left genital labia  Abscess of buttock, left    Rx / DC Orders ED Discharge Orders          Ordered    doxycycline (VIBRAMYCIN) 100 MG capsule  2 times daily        03/19/23 1923              Arabella Merles, Cordelia Poche 03/19/23 1939    Wynetta Fines, MD 03/19/23 2242

## 2023-03-20 ENCOUNTER — Other Ambulatory Visit: Payer: Self-pay

## 2023-03-20 ENCOUNTER — Emergency Department (HOSPITAL_BASED_OUTPATIENT_CLINIC_OR_DEPARTMENT_OTHER)
Admission: EM | Admit: 2023-03-20 | Discharge: 2023-03-20 | Disposition: A | Discharge status: Home / Self Care | Payer: Medicaid Other

## 2023-03-20 ENCOUNTER — Encounter (HOSPITAL_BASED_OUTPATIENT_CLINIC_OR_DEPARTMENT_OTHER): Payer: Self-pay | Admitting: Emergency Medicine

## 2023-03-20 DIAGNOSIS — N764 Abscess of vulva: Secondary | ICD-10-CM | POA: Diagnosis not present

## 2023-03-20 DIAGNOSIS — Z79899 Other long term (current) drug therapy: Secondary | ICD-10-CM | POA: Insufficient documentation

## 2023-03-20 MED ORDER — FENTANYL CITRATE PF 50 MCG/ML IJ SOSY
50.0000 ug | PREFILLED_SYRINGE | Freq: Once | INTRAMUSCULAR | Status: AC
  Administered 2023-03-20: 50 ug via INTRAMUSCULAR
  Filled 2023-03-20: qty 1

## 2023-03-20 MED ORDER — DOXYCYCLINE HYCLATE 100 MG PO TABS
100.0000 mg | ORAL_TABLET | Freq: Once | ORAL | Status: AC
Start: 2023-03-20 — End: 2023-03-20
  Administered 2023-03-20: 100 mg via ORAL
  Filled 2023-03-20: qty 1

## 2023-03-20 MED ORDER — ACETAMINOPHEN 500 MG PO TABS
1000.0000 mg | ORAL_TABLET | Freq: Once | ORAL | Status: AC
  Administered 2023-03-20: 1000 mg via ORAL
  Filled 2023-03-20: qty 2

## 2023-03-20 MED ORDER — LIDOCAINE HCL (PF) 1 % IJ SOLN
10.0000 mL | Freq: Once | INTRAMUSCULAR | Status: AC
  Administered 2023-03-20: 10 mL
  Filled 2023-03-20: qty 10

## 2023-03-20 NOTE — ED Triage Notes (Signed)
Pt arrived POV caox4 c/o abscess in the vaginal area that has been there for approx 3-4 days. Pt reports she was seen yest at [facility] for same but that it has become more inflamed and painful since.

## 2023-03-20 NOTE — ED Provider Notes (Signed)
[place] EMERGENCY DEPARTMENT AT [facility] Provider Note   CSN: [id number] Arrival date & time: 03/20/23  4098     History  Chief Complaint  Patient presents with   Brianna Bender    Brianna Bender is a 23 y.o. female with no significant past medical history presents with concern for a Brianna Bender in the vaginal area has been there for approximately 4 days.  She was seen yesterday at [facility] and I&D was attempted, but states it became more painful and enlarged today.  Denies any fever or chills.  Reports some drainage from the incision site.   Brianna Bender Associated symptoms: no fever        Home Medications Prior to Admission medications   Medication Sig Start Date End Date Taking? Authorizing Provider  acetaminophen (TYLENOL) 325 MG tablet Take 2 tablets (650 mg total) by mouth every 6 (six) hours as needed (for pain scale < 4). 05/12/19   Fair, Hoyle Sauer, MD  albuterol (VENTOLIN HFA) 108 (90 Base) MCG/ACT inhaler Inhale 1-2 puffs into the lungs every 6 (six) hours as needed for wheezing or shortness of breath. 04/17/19   Nowthen Bing, MD  doxycycline (VIBRAMYCIN) 100 MG capsule Take 1 capsule (100 mg total) by mouth 2 (two) times daily for 7 days. 03/19/23 03/26/23  Arabella Merles, PA-C  ferrous sulfate 325 (65 FE) MG tablet Take 1 tablet (325 mg total) by mouth every other day. Patient not taking: Reported on 06/06/2019 05/12/19   Joselyn Arrow, MD  guaifenesin (ROBITUSSIN) 100 MG/5ML syrup Take 200 mg by mouth 3 (three) times daily as needed for cough.    [provider]  ibuprofen (ADVIL) 600 MG tablet Take 1 tablet (600 mg total) by mouth every 6 (six) hours as needed for up to 30 doses for mild pain (pain score 1-3) or moderate pain (pain score 4-6). 01/03/23   Terald Sleeper, MD  lidocaine (XYLOCAINE) 2 % solution Use as directed 15 mLs in the mouth or throat as needed for mouth pain. 12/31/21   Redwine, Madison A, PA-C      Allergies    Patient has no known  allergies.    Review of Systems   Review of Systems  Constitutional:  Negative for fever.    Physical Exam Updated Vital Signs BP 108/65   Pulse 73   Temp 98.4 F (36.9 C) (Oral)   Resp 16   Ht 5\' 2"  (1.575 m)   Wt 77.1 kg   LMP 02/21/2023 (Exact Date)   SpO2 100%   BMI 31.09 kg/m  Physical Exam Vitals and nursing note reviewed. Exam conducted with a chaperone present.  Constitutional:      Appearance: Normal appearance.  HENT:     Head: Atraumatic.  Cardiovascular:     Rate and Rhythm: Normal rate and regular rhythm.  Pulmonary:     Effort: Pulmonary effort is normal.  Skin:    Comments: RN Savannah present for GU exam  3cm diameter raised area on left labia, mild purulent drainage. No erythema  No other labial lesions   Neurological:     General: No focal deficit present.     Mental Status: She is alert.  Psychiatric:        Mood and Affect: Mood normal.        Behavior: Behavior normal.     ED Results / Procedures / Treatments   Labs (all labs ordered are listed, but only abnormal results are displayed) Labs Reviewed - No  data to display  EKG None  Radiology No results found.  Procedures .Ultrasound ED Soft Tissue  Date/Time: 03/20/2023 1:13 PM  Performed by: Arabella Merles, PA-C Authorized by: Arabella Merles, PA-C   Procedure details:    Indications: localization of Brianna Bender     Transverse view:  Visualized   Longitudinal view:  Visualized   Images: archived   Location:    Location comment:  Left labia Findings:     Brianna Bender present    cellulitis present Comments:     Patient with significant amount of soft tissue edema, small pocket of fluid visualized .Incision and Drainage  Date/Time: 03/20/2023 1:14 PM  Performed by: Arabella Merles, PA-C Authorized by: Arabella Merles, PA-C   Consent:    Consent obtained:  Verbal   Consent given by:  Patient   Risks discussed:  Bleeding, incomplete drainage, pain and infection    Alternatives discussed:  No treatment and delayed treatment Universal protocol:    Procedure explained and questions answered to patient or proxy's satisfaction: yes     Patient identity confirmed:  Verbally with patient Location:    Type:  Brianna Bender   Size:  3cm   Location: Left labia. Pre-procedure details:    Skin preparation:  Povidone-iodine Sedation:    Sedation type:  None Anesthesia:    Anesthesia method:  Local infiltration   Local anesthetic:  Lidocaine 1% w/o epi (5ml) Procedure type:    Complexity:  Simple Procedure details:    Incision types:  Stab incision   Wound management:  Probed and deloculated   Drainage:  Serosanguinous   Drainage amount:  Scant Post-procedure details:    Procedure completion:  Tolerated well, no immediate complications     Medications Ordered in ED Medications  lidocaine (PF) (XYLOCAINE) 1 % injection 10 mL (10 mLs Infiltration Given by Other 03/20/23 1229)  fentaNYL (SUBLIMAZE) injection 50 mcg (50 mcg Intramuscular Given 03/20/23 1230)  acetaminophen (TYLENOL) tablet 1,000 mg (1,000 mg Oral Given 03/20/23 1229)  doxycycline (VIBRA-TABS) tablet 100 mg (100 mg Oral Given 03/20/23 1229)    ED Course/ Medical Decision Making/ A&P                                 Medical Decision Making Risk OTC drugs. Prescription drug management.     Differential diagnosis includes but is not limited to Brianna Bender, cellulitis, folliculitis, cyst  ED Course:  On exam, patient has a raised indurated area with slight area of fluctuance on left labia. I&D was attempted by ER provider yesterday, but according to notes no purulent material was able to be drained at that time.  Area was ultrasounded today which did show a small pocket of fluid with soft tissue edema.  Does not appear that there is a deeper tract. Given there was an that was draining today, I discussed we could continue with warm compresses and the doxycycline or try and open the tract further.   Patient wanted to try further I&D. Patient given IM fentanyl, Tylenol, and her second dose of doxycycline here today. I&D was attempted which produced mild amount of purulent drainage.  Laceration was dressed with a nonstick dressing. I discussed with patient that she needs to follow-up with OB/GYN if symptoms not improving within the next 3 to 4 days with warm compresses and the antibiotic. There could be a deeper cystic structure given I&D's have not been largely successful.  She verbalized understanding. Patient  stable and appropriate for discharge home   Impression: Cellulitis and small Brianna Bender of left labia  Disposition:  The patient was discharged home with instructions to alternate Tylenol and ibuprofen as needed for pain.  Change the dressing twice daily.  Keep area clean with soap and water.  Warm compresses 3-4 times daily.  Continue with full course of doxycycline as prescribed by provider yesterday.  Follow-up with OB/GYN if symptoms not improving in the next 3 to 4 days, their contact information has been provided. Return precautions given.             Final Clinical Impression(s) / ED Diagnoses Final diagnoses:  Left genital labial Brianna Bender    Rx / DC Orders ED Discharge Orders     None         Arabella Merles, PA-C 03/20/23 1323    Coral Spikes, DO 03/20/23 1611

## 2023-03-20 NOTE — Discharge Instructions (Signed)
You have an infection of the skin on your labia.  There is a small laceration (cut) where it was drained.  This will heal on its own. You may gently clean the area around your laceration as needed with soap and water.   Please allow the area to keep draining if it is still doing so.  Please apply warm compresses to the area 2-3 times a day for 10 minutes at a time to help promote drainage.  Please change the dressing out at least twice daily as shown here.  As discussed, please follow-up with the gynecology office listed below if your symptoms are not improving within the next week.  Take 600mg  ibuprofen, then 3 hours later take 1000mg  tylenol, then 3 hours later 600mg  ibuprofen, then 3 hours later 1000mg  tylenol, so on and so forth for pain control.  You were given your first dose of 1000 mg Tylenol here today, your next dose can be no sooner than 6:15 PM tonight  You were prescribed doxycycline yesterday.  Please pick this up from your pharmacy and take this twice daily for the next 7 days.  Please take the whole course of antibiotics even if you start feeling better.  Return to the ER should you develop fever, chills, uncontrolled pain, redness around your laceration site, any other new or concerning symptoms.

## 2023-05-02 ENCOUNTER — Ambulatory Visit (HOSPITAL_BASED_OUTPATIENT_CLINIC_OR_DEPARTMENT_OTHER): Payer: Medicaid Other | Admitting: Family Medicine

## 2023-05-02 ENCOUNTER — Encounter (HOSPITAL_BASED_OUTPATIENT_CLINIC_OR_DEPARTMENT_OTHER): Payer: Self-pay

## 2023-05-02 ENCOUNTER — Telehealth (HOSPITAL_BASED_OUTPATIENT_CLINIC_OR_DEPARTMENT_OTHER): Payer: Self-pay | Admitting: Family Medicine

## 2023-05-02 NOTE — Telephone Encounter (Signed)
 Attempted to call pt to reschedule appt as provider is out of the office today. Unable to reach pt after trying to call pt 3 times due to getting to a message that the call could not be completed at this time.  I have sent a mychart message to pt as well letting her know that provider was out of the office and that we needed to get her appt rescheduled. Let her know that we were going to cancel her appt for now until she called the office back to get this rescheduled.

## 2023-07-04 ENCOUNTER — Telehealth: Admitting: Family Medicine

## 2023-07-04 DIAGNOSIS — G43911 Migraine, unspecified, intractable, with status migrainosus: Secondary | ICD-10-CM

## 2023-07-04 DIAGNOSIS — R110 Nausea: Secondary | ICD-10-CM | POA: Diagnosis not present

## 2023-07-04 DIAGNOSIS — R519 Headache, unspecified: Secondary | ICD-10-CM | POA: Diagnosis not present

## 2023-07-04 NOTE — Progress Notes (Signed)
 [place]   Needs to be seen in person for IM injections to get migraine relieved.  Going to local urgent care once mother can drive her.

## 2024-01-28 DIAGNOSIS — L729 Follicular cyst of the skin and subcutaneous tissue, unspecified: Secondary | ICD-10-CM | POA: Diagnosis not present

## 2024-01-31 ENCOUNTER — Ambulatory Visit: Admitting: Family Medicine
